# Patient Record
Sex: Female | Born: 1980 | Race: White | Hispanic: No | Marital: Married | Smoking: Never smoker
Health system: Southern US, Community
[De-identification: ages and names within clinical notes are randomized; demographics above are authoritative.]

## PROBLEM LIST (undated history)

## (undated) DIAGNOSIS — Z9882 Breast implant status: Secondary | ICD-10-CM

## (undated) DIAGNOSIS — E039 Hypothyroidism, unspecified: Secondary | ICD-10-CM

## (undated) HISTORY — DX: Breast implant status: Z98.82

---

## 2004-09-26 ENCOUNTER — Emergency Department: Payer: Self-pay | Admitting: Emergency Medicine

## 2008-10-12 ENCOUNTER — Observation Stay: Payer: Self-pay | Admitting: Obstetrics and Gynecology

## 2009-02-18 ENCOUNTER — Encounter: Payer: Self-pay | Admitting: Maternal & Fetal Medicine

## 2009-06-24 ENCOUNTER — Observation Stay: Payer: Self-pay

## 2009-09-01 ENCOUNTER — Inpatient Hospital Stay: Payer: Self-pay | Admitting: Obstetrics and Gynecology

## 2010-01-04 ENCOUNTER — Ambulatory Visit: Payer: Self-pay | Admitting: Family Medicine

## 2010-03-29 ENCOUNTER — Ambulatory Visit: Payer: Self-pay | Admitting: Internal Medicine

## 2010-05-26 ENCOUNTER — Ambulatory Visit: Payer: Self-pay | Admitting: Otolaryngology

## 2011-05-30 ENCOUNTER — Ambulatory Visit: Payer: Self-pay | Admitting: Otolaryngology

## 2011-07-14 ENCOUNTER — Ambulatory Visit: Payer: Self-pay | Admitting: Obstetrics and Gynecology

## 2011-07-17 ENCOUNTER — Inpatient Hospital Stay: Payer: Self-pay | Admitting: Obstetrics and Gynecology

## 2011-09-18 ENCOUNTER — Ambulatory Visit: Payer: Self-pay | Admitting: Otolaryngology

## 2012-03-18 IMAGING — US US THYROID
1 series · 17 of 25 positions shown · non-contrast
Comparison: none

REASON FOR EXAM: thyroid nodule
COMMENTS:

[Series 1: us thyroid · 17 of 54 slices shown]
[im 1/54]
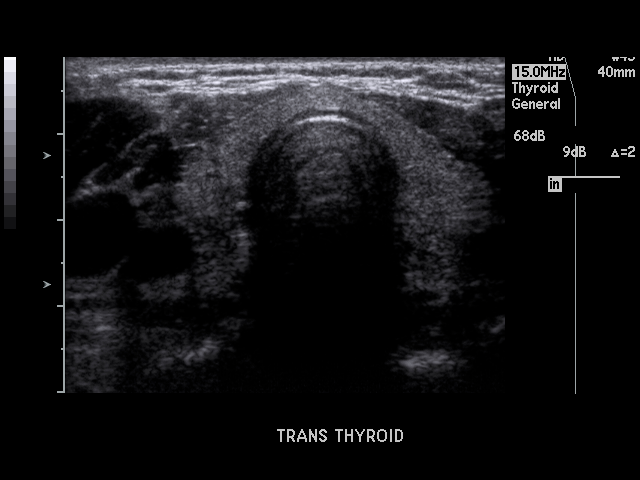
[im 5/54]
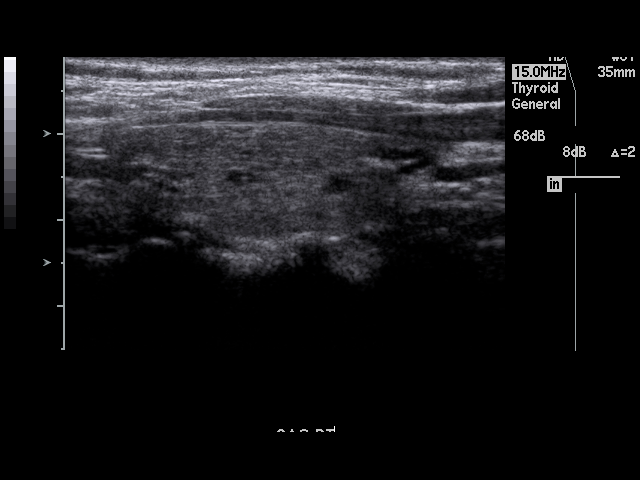
[im 7/54]
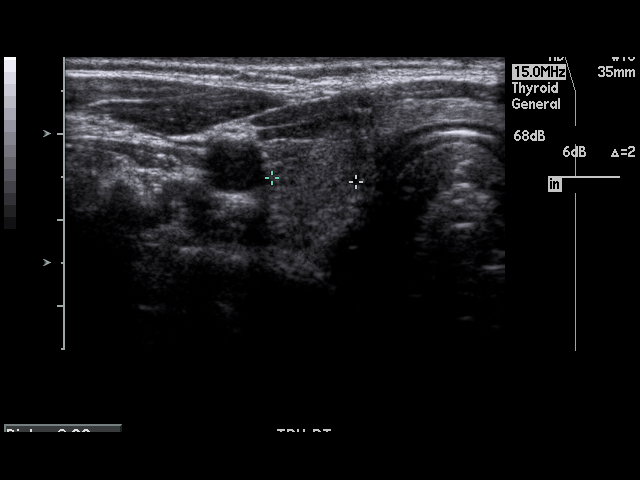
[im 12/54]
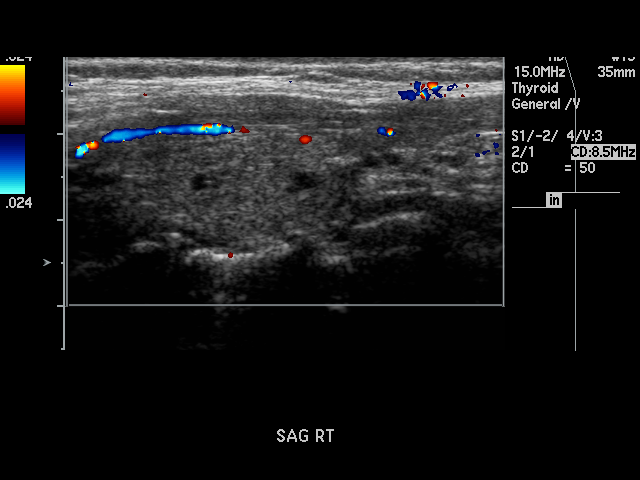
[im 14/54]
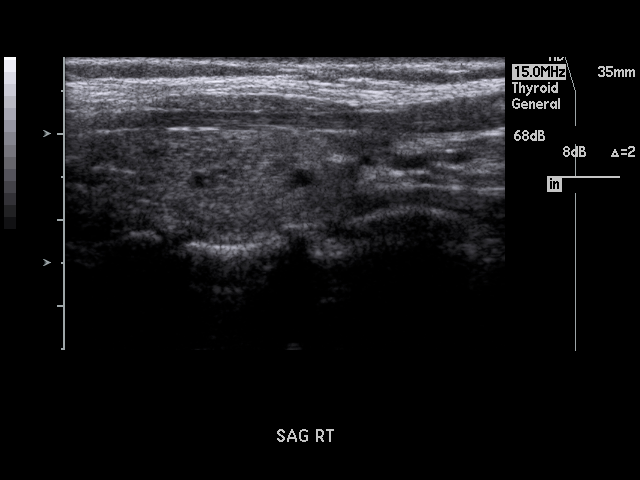
[im 18/54]
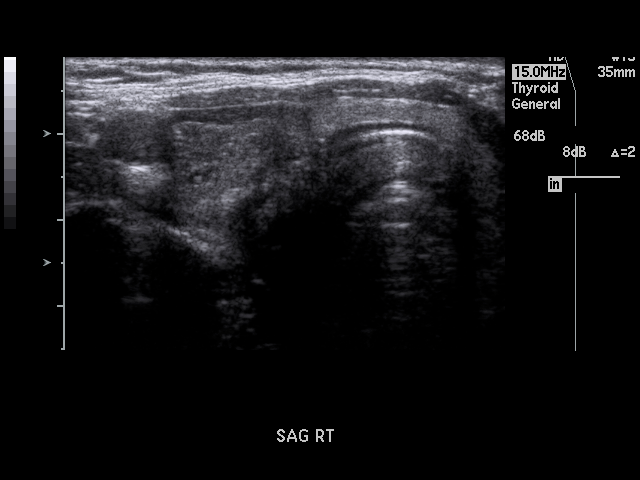
[im 20/54]
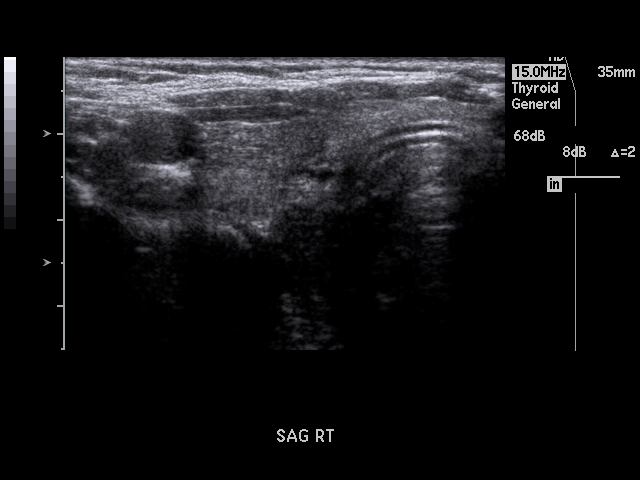
[im 25/54]
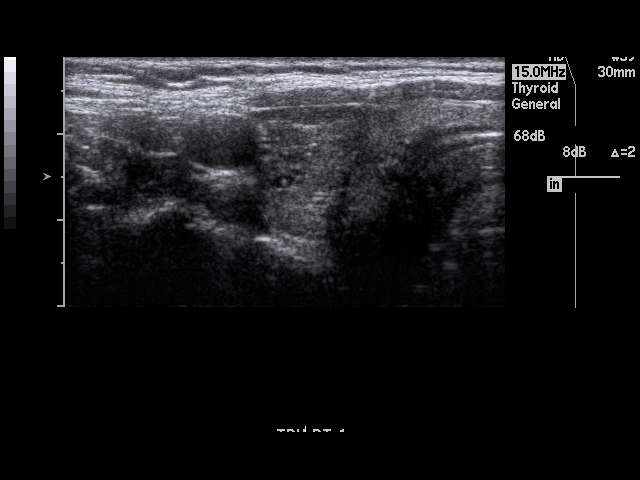
[im 27/54]
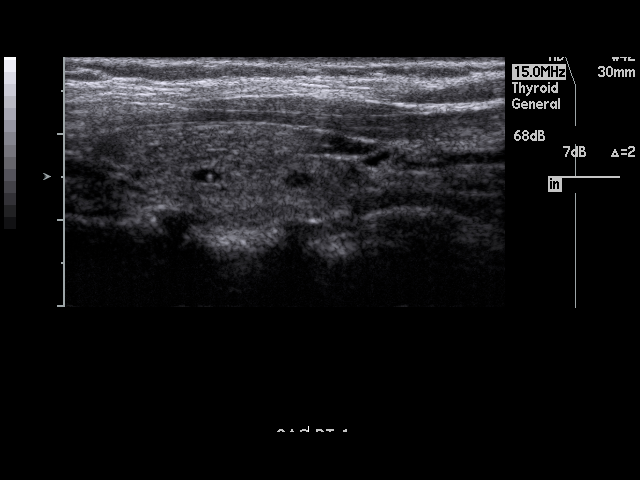
[im 29/54]
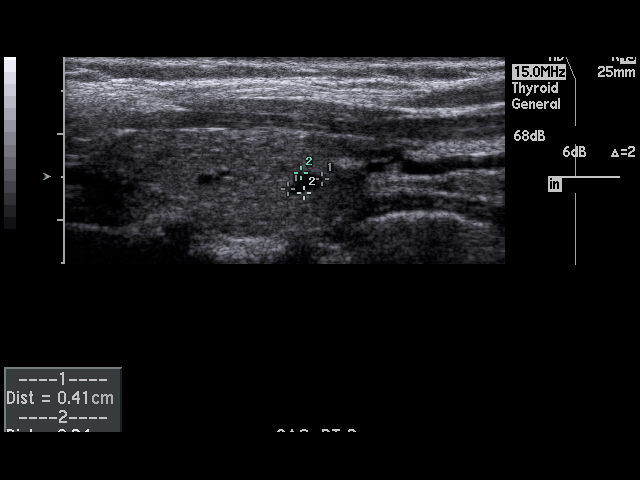
[im 34/54]
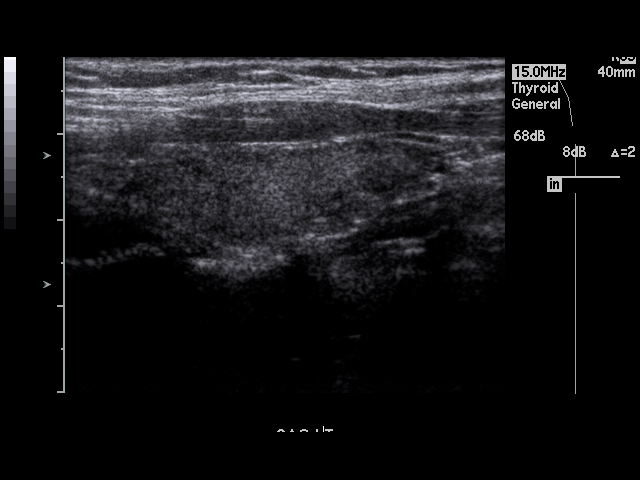
[im 36/54]
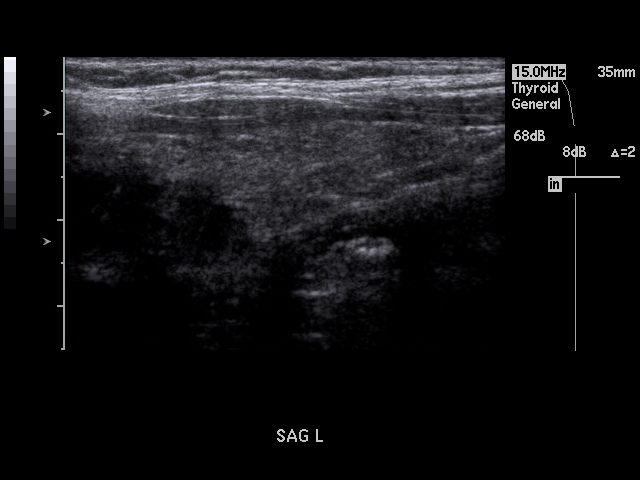
[im 40/54]
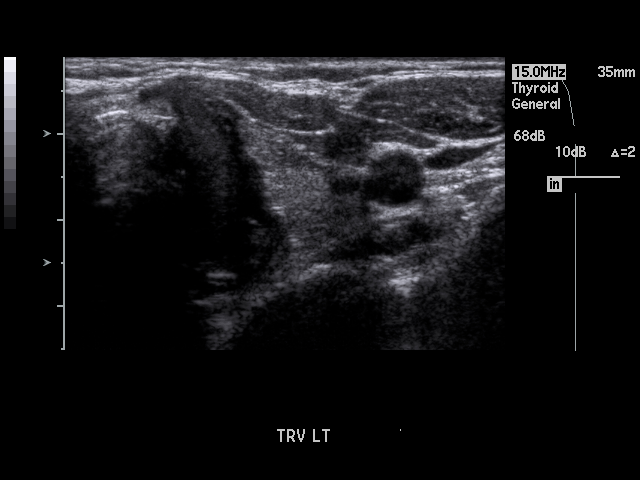
[im 42/54]
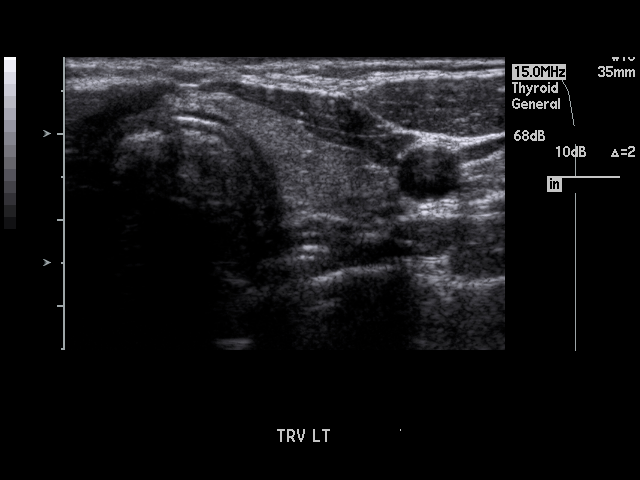
[im 47/54]
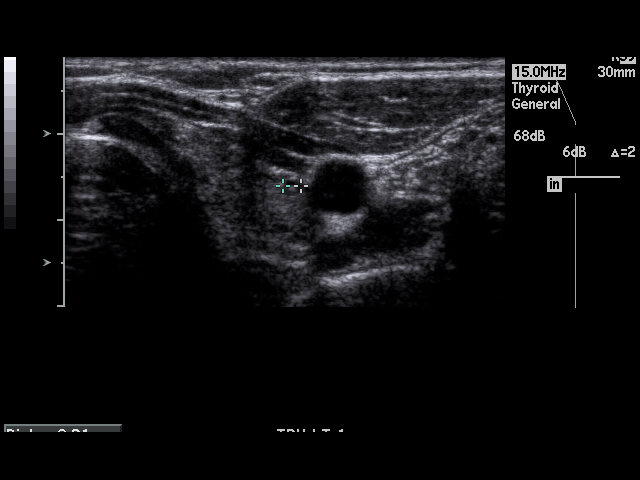
[im 49/54]
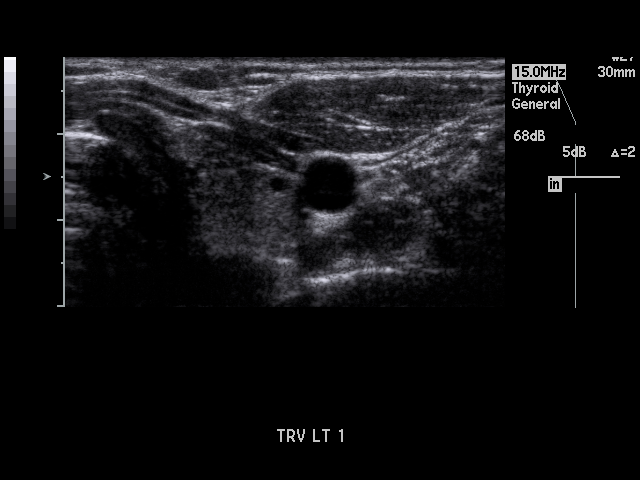
[im 54/54]
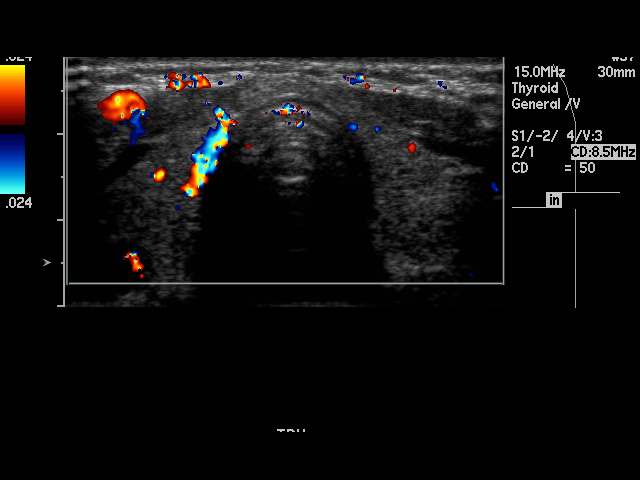

[17 of 25 positions shown; findings below may reference images not displayed]

PROCEDURE:     DALVIN - DALVIN THYROID  - May 30, 2011 [DATE]

RESULT:     The right lobe of the thyroid measures 2.9 cm x 0.98 cm x
cm and the left lobe measures 3.83 cm x 1.06 cm x 1.25 cm. The previously
present 3.4 cm, anechoic nodule in the right lobe, described on the exam of
March 29, 2010, is no longer present consistent with interval aspiration. The
current exam shows a 3 mm, anechoic nodule at the midpole on the right.
There is a tiny associated echodensity that may represent a fleck of
calcium. At the lower pole of the right lobe, there is a 4.1 mm, anechoic or
near anechoic nodule. At the lower pole of the left lobe, there is a 2.3 mm,
anechoic nodule with an associated bright echo that may represent a fleck of
calcium associated with the wall. No additional nodules are seen. No
calcification within the thyroid parenchyma peripheral to the nodules is
observed. The thyroid echotexture is homogeneous.
IMPRESSION: 1.  The large anechoic mass previously noted in the right lobe is no longer
seen consistent with interval aspiration.
2.  The current exam shows two, tiny anechoic areas on the right with the
larger measuring 3 mm at maximum diameter and containing a fleck of calcium.
3.  On the left, there is a 2.3 mm, anechoic nodule containing a fleck of
calcium.
4.  The thyroid echotexture is homogeneous.

## 2012-07-07 IMAGING — US US THYROID
1 series · 17 of 25 positions shown · non-contrast
Comparison: none

REASON FOR EXAM: nodule  compare to May 2011
COMMENTS:

[Series 1: us thyroid · 17 of 56 slices shown]
[im 1/56]
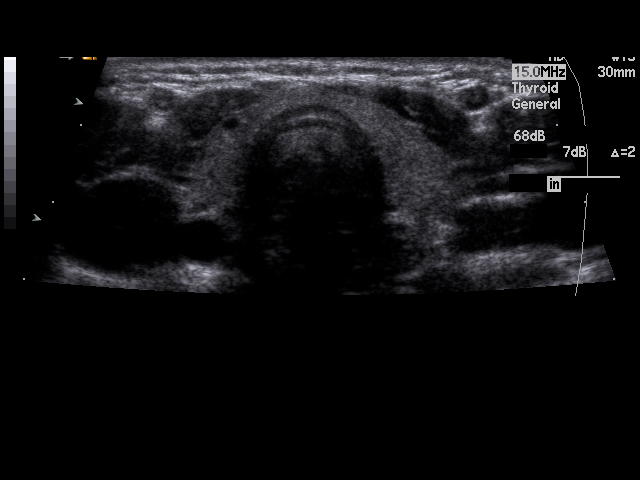
[im 5/56]
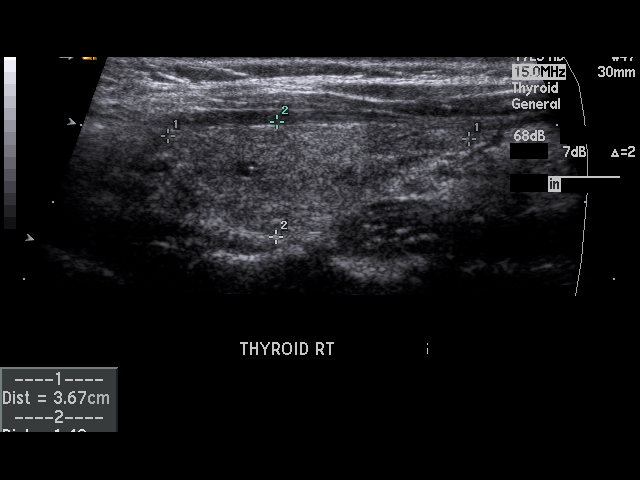
[im 7/56]
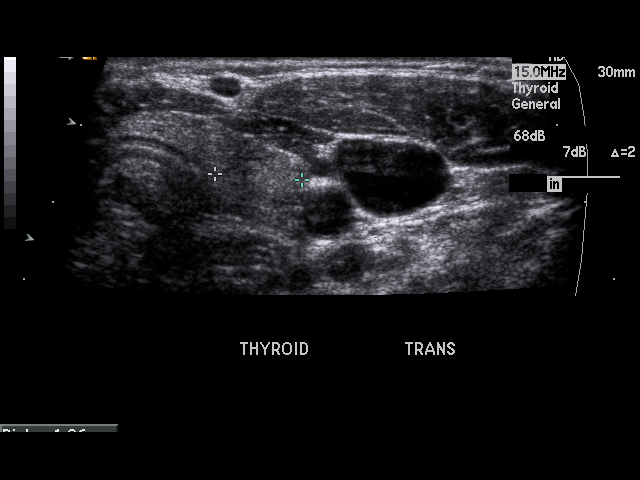
[im 12/56]
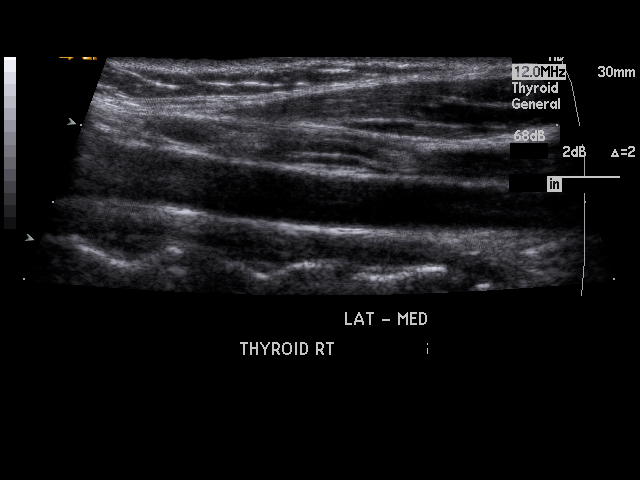
[im 14/56]
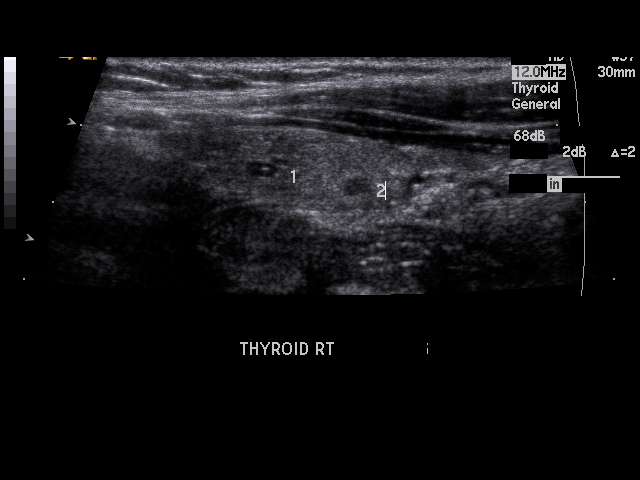
[im 19/56]
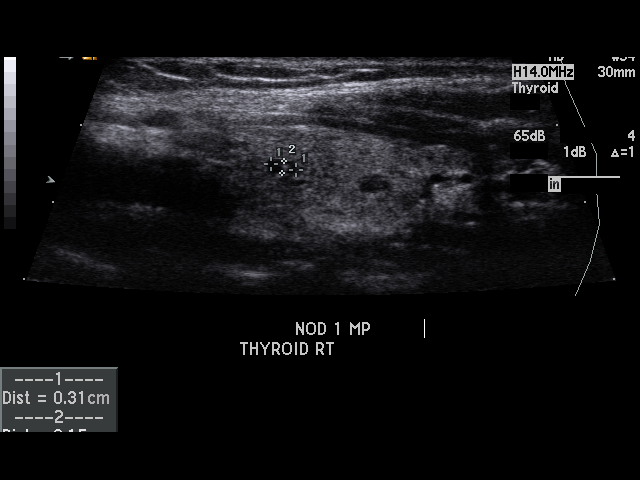
[im 21/56]
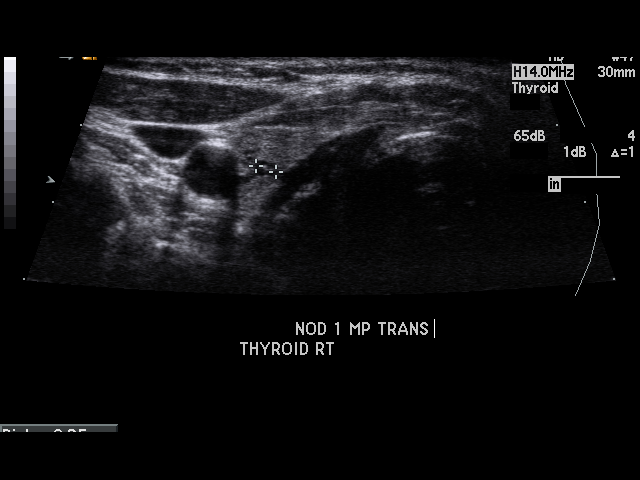
[im 26/56]
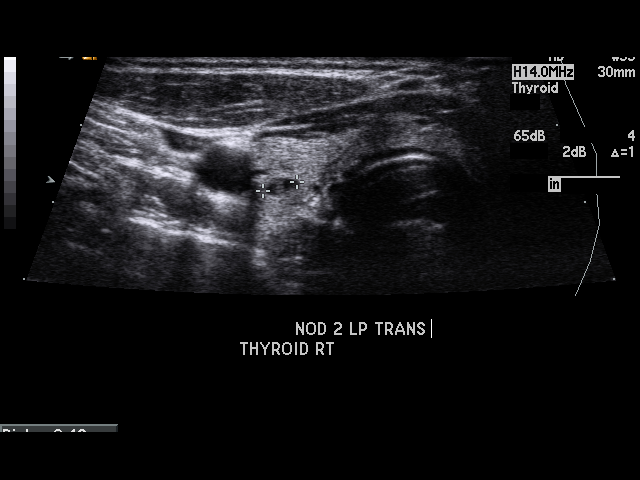
[im 28/56]
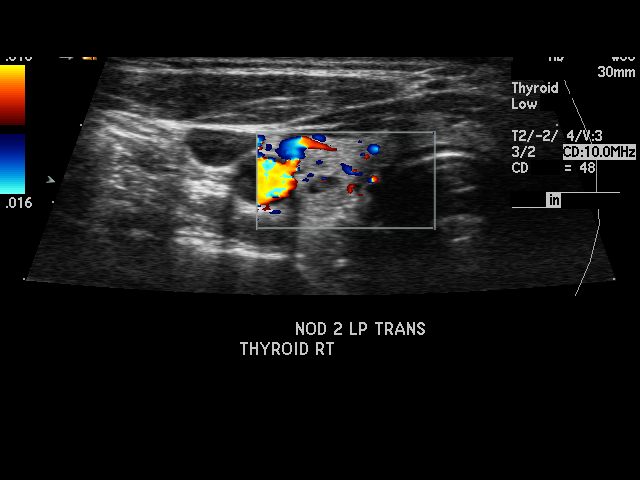
[im 30/56]
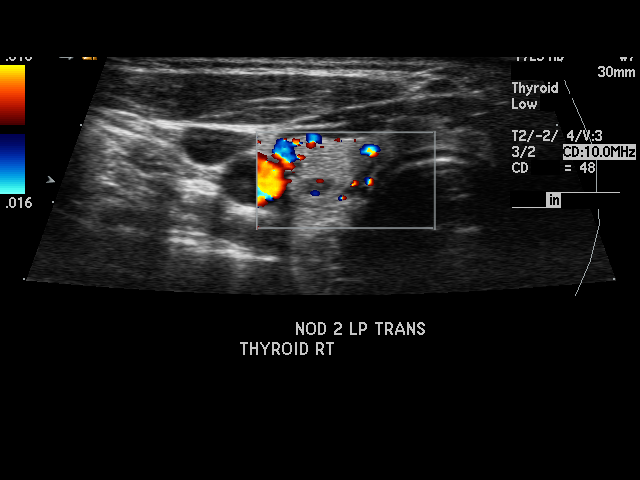
[im 35/56]
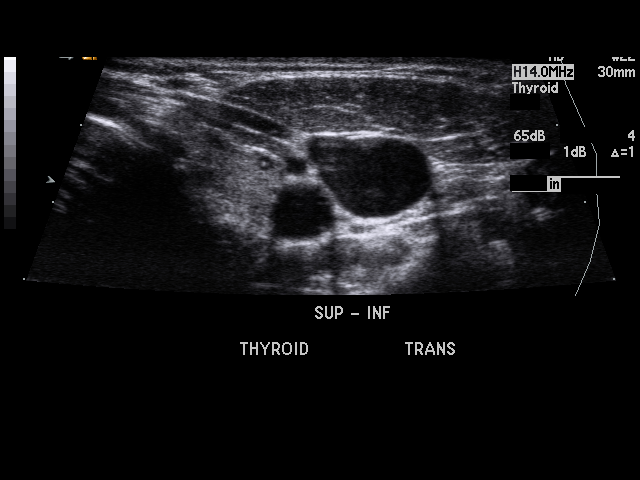
[im 37/56]
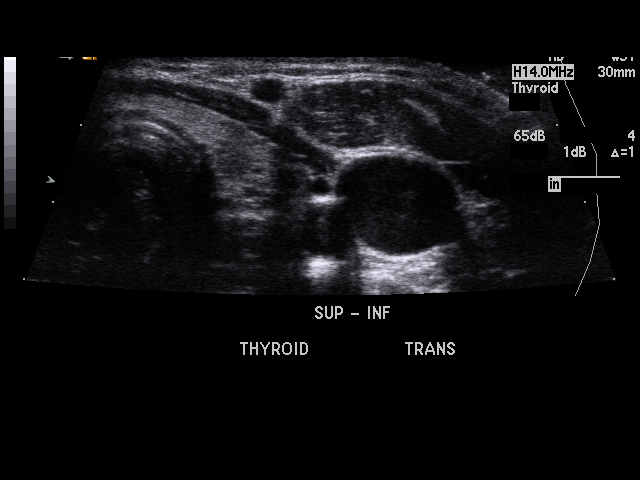
[im 42/56]
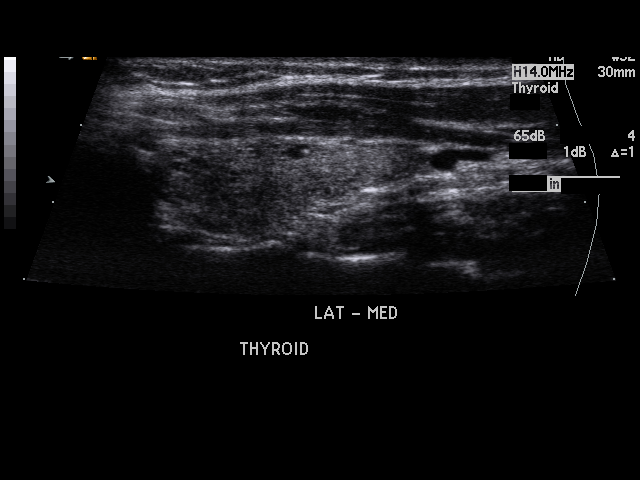
[im 44/56]
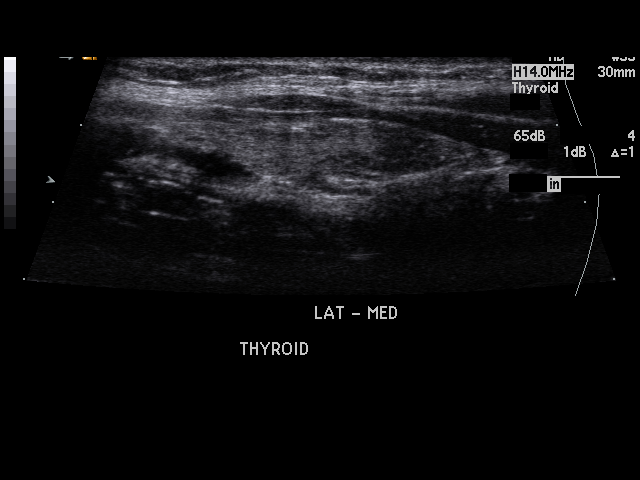
[im 49/56]
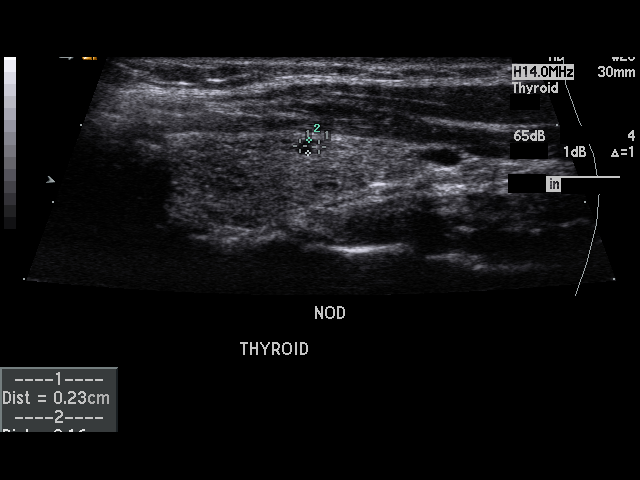
[im 51/56]
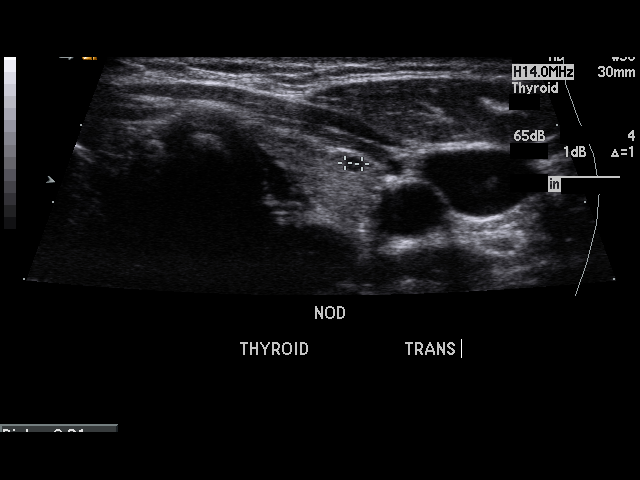
[im 56/56]
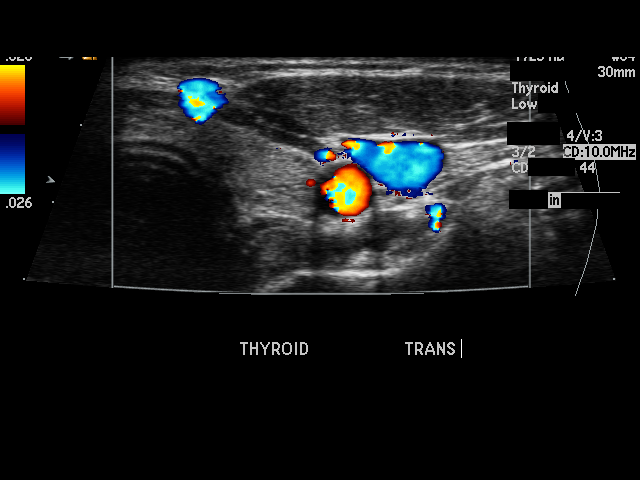

[17 of 25 positions shown; findings below may reference images not displayed]

PROCEDURE:     HOSAIN - HOSAIN THYROID  - September 18, 2011 [DATE]

RESULT:     The right lobe of the thyroid measures 3.67 cm x 1.45 cm x
cm and the left lobe measures 3.8 cm x 1.24 cm x 1.06 cm. On the right,
there is a 3.1 mm anechoic nodule containing a fleck of calcium and
unchanged as compared to the prior exam. There is a second hypoechoic nodule
in the lower pole on the right. The nodule measures 4.1 mm in diameter and
does not appear appreciably changed as compared to the previous exam. No new
thyroid masses or nodules are identified. In the left lobe there is a near
anechoic 2.3 mm nodule containing a fleck of calcium and located at the
upper pole. This too is unchanged as compared to the prior exam.
IMPRESSION: 1. The thyroid lobes are within normal limits for size bilaterally.
2. There are a few tiny thyroid nodules observed bilaterally and which
appear stable as compared to the prior exam.

## 2014-05-19 ENCOUNTER — Ambulatory Visit: Payer: Self-pay | Admitting: Obstetrics and Gynecology

## 2014-05-19 LAB — CBC WITH DIFFERENTIAL/PLATELET
Basophil #: 0 10*3/uL (ref 0.0–0.1)
Basophil %: 0.2 %
EOS ABS: 0.1 10*3/uL (ref 0.0–0.7)
EOS PCT: 1.4 %
HCT: 36.6 % (ref 35.0–47.0)
HGB: 12.7 g/dL (ref 12.0–16.0)
LYMPHS ABS: 1.6 10*3/uL (ref 1.0–3.6)
LYMPHS PCT: 18.8 %
MCH: 34.3 pg — ABNORMAL HIGH (ref 26.0–34.0)
MCHC: 34.8 g/dL (ref 32.0–36.0)
MCV: 99 fL (ref 80–100)
Monocyte #: 0.7 x10 3/mm (ref 0.2–0.9)
Monocyte %: 8.6 %
NEUTROS ABS: 6.2 10*3/uL (ref 1.4–6.5)
NEUTROS PCT: 71 %
Platelet: 125 10*3/uL — ABNORMAL LOW (ref 150–440)
RBC: 3.71 10*6/uL — ABNORMAL LOW (ref 3.80–5.20)
RDW: 14 % (ref 11.5–14.5)
WBC: 8.7 10*3/uL (ref 3.6–11.0)

## 2014-05-20 ENCOUNTER — Inpatient Hospital Stay: Payer: Self-pay | Admitting: Obstetrics and Gynecology

## 2014-05-20 LAB — RAPID HIV SCREEN (HIV 1/2 AB+AG)

## 2014-05-21 LAB — HEMATOCRIT: HCT: 31.7 % — ABNORMAL LOW (ref 35.0–47.0)

## 2014-05-22 LAB — PATHOLOGY REPORT

## 2015-02-27 NOTE — Op Note (Signed)
PATIENT NAME:  Kari Gibson, Kari Gibson MR#:  [id number] DATE OF BIRTH:  1981-02-20  DATE OF PROCEDURE:  05/20/2014.  PREOPERATIVE DIAGNOSIS:  1. Elective repeat cesarean section.  2. Elective permanent sterilization.  POSTOPERATIVE DIAGNOSES:  1. Elective repeat cesarean section.  2. Elective permanent sterilization.  PROCEDURE:  1.  Repeat low transverse cesarean section.  2.  Bilateral tubal ligation, [place].   SURGEON: Suzy Bouchard, MD.  ANESTHESIA: Spinal.   INDICATIONS: This is a 34 year old gravida 4 para 2 patient at 15 + 0 weeks estimated gestational age. Patient with prior cesarean, elects for repeat cesarean section and elective permanent sterilization. The patient reconfirms the desire for elective permanent sterilization.   PROCEDURE: After adequate spinal anesthesia, the patient was placed in the dorsal supine position with a hip roll under the right side.    A Pfannenstiel incision was made 2 fingerbreadths above the symphysis pubis. Sharp dissection was used to identify the fascia. The fascia was opened in the midline and opened in a transverse fashion. The superior aspect of the fascia was grasped with Kocher clamps and the rectus muscles were dissected free. The inferior aspect of the fascia was grasped with Coker clamps and the pyramidalis muscle was dissected free. Entry into the peritoneal cavity was accomplished sharply. The vesicouterine peritoneal fold was identified and opened. A bladder flap was created. The bladder was reflected inferiorly.   A low transverse uterine incision was made. Upon entry into the endometrial cavity, clear fluid resulted. The uterine incision was extended with blunt transverse traction. The fetal head, shoulders and body were delivered without difficulty, a vigorous female.  The cord was doubly clamped, and the infant was passed to the neonatal staff, who assigned Apgar scores of 8 and 9.   The placenta was then manually delivered and  the uterus was exteriorized. The endometrial cavity was wiped clean with laparotomy tape and the cervix was opened with ring forceps and this was passed off the operative field. The uterine incision was closed with #1 chromic suture in a running locking fashion with good approximation of edges. Good hemostasis was noted.   Attention was directed to the patient's right fallopian tube, which was grasped at the midportion of the fallopian tube and 2 separate 0-plain gut sutures were applied. A 1.5-cm portion of fallopian tube was removed. A similar procedure was repeated on the patient's left fallopian tube. Again, 2 separate 0-plain gut sutures were applied and a 1.5 cm portion of fallopian tube was removed.   Good hemostasis was noted. The posterior cul-de-sac was irrigated and suctioned. The uterus was placed back into the abdominal cavity and the paracolic gutters were wiped clean with laparotomy tape. The uterine incision again appeared hemostatic. Interceed was placed over the lower uterine segment in a T-shaped fashion. Tubal ligation sites appeared hemostatic.   The superior aspect of the fascia was grasped with Coker clamps and the On-Q pump catheters were advanced from an inferoumbilical position to a subfascial position. The fascia was closed over the top these with 0-Vicryl suture in a running nonlocking fashion with good approximation of edges. Subcutaneous tissues were irrigated and the subcutaneous tissues Bovied for hemostasis and the skin was reapproximated with staples.   ESTIMATED BLOOD LOSS: 700 mL.   INTRAOPERATIVE FLUIDS: 1000 mL.    The patient did receive 2 grams IV Ancef prior to commencement of the case. The patient was taken to the recovery room in good condition.    ____________________________ Suzy Bouchard, MD  tjs:lt D: 05/20/2014 10:30:01 ET T: 05/20/2014 12:54:56 ET JOB#: [id number]  cc: Suzy Bouchardhomas J. Reyhan Moronta, MD, <Dictator> Suzy BouchardHOMAS J Ural Acree  MD ELECTRONICALLY SIGNED 05/20/2014 21:34

## 2016-08-28 ENCOUNTER — Other Ambulatory Visit: Payer: Self-pay | Admitting: Obstetrics and Gynecology

## 2016-08-28 DIAGNOSIS — Z1231 Encounter for screening mammogram for malignant neoplasm of breast: Secondary | ICD-10-CM

## 2016-08-30 ENCOUNTER — Ambulatory Visit
Admission: RE | Admit: 2016-08-30 | Discharge: 2016-08-30 | Disposition: A | Discharge status: Home / Self Care | Payer: BLUE CROSS/BLUE SHIELD | Source: Ambulatory Visit | Attending: Obstetrics and Gynecology | Admitting: Obstetrics and Gynecology

## 2016-08-30 ENCOUNTER — Encounter: Payer: Self-pay | Admitting: Radiology

## 2016-08-30 DIAGNOSIS — Z1231 Encounter for screening mammogram for malignant neoplasm of breast: Secondary | ICD-10-CM

## 2021-06-14 ENCOUNTER — Ambulatory Visit (INDEPENDENT_AMBULATORY_CARE_PROVIDER_SITE_OTHER): Payer: 59

## 2021-06-14 ENCOUNTER — Other Ambulatory Visit: Payer: Self-pay

## 2021-06-14 ENCOUNTER — Ambulatory Visit (INDEPENDENT_AMBULATORY_CARE_PROVIDER_SITE_OTHER): Payer: 59 | Admitting: Podiatry

## 2021-06-14 ENCOUNTER — Encounter: Payer: Self-pay | Admitting: Podiatry

## 2021-06-14 DIAGNOSIS — M2011 Hallux valgus (acquired), right foot: Secondary | ICD-10-CM | POA: Diagnosis not present

## 2021-06-14 DIAGNOSIS — Z01818 Encounter for other preprocedural examination: Secondary | ICD-10-CM

## 2021-06-14 DIAGNOSIS — M779 Enthesopathy, unspecified: Secondary | ICD-10-CM

## 2021-06-14 NOTE — Progress Notes (Signed)
Subjective:  Patient ID: Kari Gibson, female    DOB: 04-May-1981,  MRN: [id number]  No chief complaint on file.   40 y.o. female presents with the above complaint.  Patient presents with complaint of right bunion pain.  Patient states is painful to touch.  She states the bunion is not too large however it is definitely causing her a lot of pain.  She has tried some conservative treatment options include padding protecting shoe gear modification none of which has helped.  She is here to discuss surgical intervention.  She denies seeing anyone else prior to seeing me.  She denies any other acute complaints.  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  Review of Systems: Negative except as noted in the HPI. Denies N/V/F/Ch.  No past medical history on file. No current outpatient medications on file.  Social History   Tobacco Use  Smoking Status Not on file  Smokeless Tobacco Not on file    Not on File Objective:  There were no vitals filed for this visit. There is no height or weight on file to calculate BMI. Constitutional Well developed. Well nourished.  Vascular Dorsalis pedis pulses palpable bilaterally. Posterior tibial pulses palpable bilaterally. Capillary refill normal to all digits.  No cyanosis or clubbing noted. Pedal hair growth normal.  Neurologic Normal speech. Oriented to person, place, and time. Epicritic sensation to light touch grossly present bilaterally.  Dermatologic Nails well groomed and normal in appearance. No open wounds. No skin lesions.  Orthopedic: Normal joint ROM without pain or crepitus bilaterally. 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  Pain on palpation.  This is a tracking not a track bound deformity.  No intra-articular first MPJ pain noted. Left 1st MPJ diminished range of motion. Left 1st TMT without gross hypermobility. Right 1st MPJ diminished range of motion  Right 1st TMT without gross  hypermobility. Lesser digital contractures present right.   Radiographs: Taken and reviewed. Hallux abductovalgus deformity present. Metatarsal parabola normal. 1st/2nd IMA: Moderate; TSP: 5 out of 7  Assessment:   1. Tendinitis    Plan:  Patient was evaluated and treated and all questions answered.  Hallux abductovalgus deformity, right -XR as above. -Patient has failed all conservative therapy and wishes to proceed with surgical intervention. All risks, benefits, and alternatives discussed with patient. No guarantees given. Consent reviewed and signed by patient. Post-op course explained at length. -Planned procedures: Chevron osteotomy with possible phalangeal osteotomy -Risk factors: None -I discussed my preoperative intraoperative and postoperative plan with the patient in extensive detail.  Given the patient has failed all conservative treatment options at this time she will benefit from surgical intervention to correct the bunion with a possible bunionectomy and phalangeal osteotomy.  I discussed my plans in extensive detail she states understand like to proceed with the surgery.  She will be weightbearing as tolerated cam boot afterwards. -Informed surgical risk consent was reviewed and read aloud to the patient.  I reviewed the films.  I have discussed my findings with the patient in great detail.  I have discussed all risks including but not limited to infection, stiffness, scarring, limp, disability, deformity, damage to blood vessels and nerves, numbness, poor healing, need for braces, arthritis, chronic pain, amputation, death.  All benefits and realistic expectations discussed in great detail.  I have made no promises as to the outcome.  I have provided realistic expectations.  I have offered the patient a 2nd opinion, which they have  declined and assured me they preferred to proceed despite the risks   No follow-ups on file.

## 2021-07-06 ENCOUNTER — Other Ambulatory Visit: Payer: Self-pay

## 2021-07-06 ENCOUNTER — Emergency Department
Admission: EM | Admit: 2021-07-06 | Discharge: 2021-07-06 | Disposition: A | Discharge status: Home / Self Care | Payer: 59 | Attending: Emergency Medicine | Admitting: Emergency Medicine

## 2021-07-06 ENCOUNTER — Ambulatory Visit: Admit: 2021-07-06 | Payer: 59 | Source: Home / Self Care

## 2021-07-06 ENCOUNTER — Emergency Department: Payer: 59

## 2021-07-06 DIAGNOSIS — S46912A Strain of unspecified muscle, fascia and tendon at shoulder and upper arm level, left arm, initial encounter: Secondary | ICD-10-CM | POA: Diagnosis not present

## 2021-07-06 DIAGNOSIS — Y92410 Unspecified street and highway as the place of occurrence of the external cause: Secondary | ICD-10-CM | POA: Diagnosis not present

## 2021-07-06 DIAGNOSIS — S40912A Unspecified superficial injury of left shoulder, initial encounter: Secondary | ICD-10-CM | POA: Diagnosis present

## 2021-07-06 MED ORDER — METHOCARBAMOL 500 MG PO TABS: 500.0000 mg | ORAL_TABLET | Freq: Four times a day (QID) | ORAL | 0 refills | Status: AC | PRN

## 2021-07-06 MED ORDER — TRAMADOL HCL 50 MG PO TABS: 50.0000 mg | ORAL_TABLET | Freq: Four times a day (QID) | ORAL | 0 refills | Status: AC | PRN

## 2021-07-06 MED ORDER — MELOXICAM 15 MG PO TABS: 15.0000 mg | ORAL_TABLET | Freq: Every day | ORAL | 0 refills | Status: AC
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
## 2021-07-06 NOTE — Discharge Instructions (Addendum)
Follow up with primary care or [facility].  Return to the ER for symptoms that change or worsen if unable to schedule an appointment.

## 2021-07-22 ENCOUNTER — Telehealth: Payer: Self-pay | Admitting: Urology

## 2021-07-22 NOTE — Telephone Encounter (Signed)
DOS - 08/15/21   [place] BUNIONECTOMY RIGHT --- [id number] DOUBLE OSTEOTOMY RIGHT --- [phone]  [facility] EFFECTIVE DATE - 11/06/20   RECEIVED FAX FROM [facility] STATING THAT CPT CODES 74944 AND [phone] HAS BEEN APPROVED, AUTH # [id number], GOOD FROM 08/15/21 - 08/15/21.

## 2021-08-15 ENCOUNTER — Encounter: Payer: Self-pay | Admitting: Podiatry

## 2021-08-15 ENCOUNTER — Other Ambulatory Visit: Payer: Self-pay | Admitting: Podiatry

## 2021-08-15 DIAGNOSIS — M2011 Hallux valgus (acquired), right foot: Secondary | ICD-10-CM | POA: Diagnosis not present

## 2021-08-15 MED ORDER — IBUPROFEN 800 MG PO TABS: 800.0000 mg | ORAL_TABLET | Freq: Four times a day (QID) | ORAL | 1 refills | Status: AC | PRN

## 2021-08-15 MED ORDER — OXYCODONE-ACETAMINOPHEN 5-325 MG PO TABS
1.0000 | ORAL_TABLET | ORAL | 0 refills | Status: AC | PRN
Start: 2021-08-15 — End: ?

## 2021-08-23 ENCOUNTER — Other Ambulatory Visit: Payer: Self-pay

## 2021-08-23 ENCOUNTER — Encounter: Payer: Self-pay | Admitting: Podiatry

## 2021-08-23 ENCOUNTER — Ambulatory Visit (INDEPENDENT_AMBULATORY_CARE_PROVIDER_SITE_OTHER): Payer: 59 | Admitting: Podiatry

## 2021-08-23 ENCOUNTER — Ambulatory Visit (INDEPENDENT_AMBULATORY_CARE_PROVIDER_SITE_OTHER): Payer: 59

## 2021-08-23 DIAGNOSIS — Z9889 Other specified postprocedural states: Secondary | ICD-10-CM

## 2021-08-23 DIAGNOSIS — M2011 Hallux valgus (acquired), right foot: Secondary | ICD-10-CM | POA: Diagnosis not present

## 2021-08-23 NOTE — Progress Notes (Signed)
  Subjective:  Patient ID: Kari Gibson, female    DOB: 09/05/1981,  MRN: [id number]  Chief Complaint  Patient presents with   Routine Post [name] 10.10.22    DOS: 08/15/2021 Procedure: Right bunionectomy  40 y.o. female returns for post-op check.  Patient states she is doing well.  Pain is controlled.  Occasional numbness and tingling.  Weightbearing as tolerated in cam boot.  She denies any other acute complaints.  Review of Systems: Negative except as noted in the HPI. Denies N/V/F/Ch.  No past medical history on file.  Current Outpatient Medications:    ibuprofen (ADVIL) 800 MG tablet, Take 1 tablet (800 mg total) by mouth every 6 (six) hours as needed., Disp: 60 tablet, Rfl: 1   meloxicam (MOBIC) 15 MG tablet, Take 1 tablet (15 mg total) by mouth daily., Disp: 30 tablet, Rfl: 0   methocarbamol (ROBAXIN) 500 MG tablet, Take 1 tablet (500 mg total) by mouth every 6 (six) hours as needed for muscle spasms., Disp: 30 tablet, Rfl: 0   oxyCODONE-acetaminophen (PERCOCET) 5-325 MG tablet, Take 1 tablet by mouth every 4 (four) hours as needed for severe pain., Disp: 30 tablet, Rfl: 0   traMADol (ULTRAM) 50 MG tablet, Take 1 tablet (50 mg total) by mouth every 6 (six) hours as needed., Disp: 12 tablet, Rfl: 0  Social History   Tobacco Use  Smoking Status Not on file  Smokeless Tobacco Not on file    Not on File Objective:  There were no vitals filed for this visit. There is no height or weight on file to calculate BMI. Constitutional Well developed. Well nourished.  Vascular Foot warm and well perfused. Capillary refill normal to all digits.   Neurologic Normal speech. Oriented to person, place, and time. Epicritic sensation to light touch grossly present bilaterally.  Dermatologic Skin healing well without signs of infection. Skin edges well coapted without signs of infection.  Orthopedic: Tenderness to palpation noted about the surgical site.   Radiographs: 3 views of  skeletally mature adult right foot: Hardware is intact.  No signs of loosening or backing out noted.  Reduction of sesamoid noted.  Reduction of bunion deformity noted. Assessment:   1. Hav (hallux abducto valgus), right   2. Status post foot surgery    Plan:  Patient was evaluated and treated and all questions answered.  S/p foot surgery right -Progressing as expected post-operatively. -XR: See above -WB Status: Weightbearing as tolerated in cam boot -Sutures: Intact.  No clinical signs of dehiscence noted.  No complication noted. -Medications: None -Foot redressed.  No follow-ups on file.

## 2021-09-06 ENCOUNTER — Other Ambulatory Visit: Payer: Self-pay

## 2021-09-06 ENCOUNTER — Ambulatory Visit (INDEPENDENT_AMBULATORY_CARE_PROVIDER_SITE_OTHER): Payer: 59 | Admitting: Podiatry

## 2021-09-06 DIAGNOSIS — Z9889 Other specified postprocedural states: Secondary | ICD-10-CM

## 2021-09-06 DIAGNOSIS — M2011 Hallux valgus (acquired), right foot: Secondary | ICD-10-CM

## 2021-09-06 NOTE — Progress Notes (Signed)
  Subjective:  Patient ID: Kari Gibson, female    DOB: [birth year],  MRN: [id number]  Chief Complaint  Patient presents with   Routine Post Op     POV #2 DOS 08/15/2021 RT BUNIONECTOMY W/POSSIBLE PHELANGEAL OSTEOTOMY    DOS: 08/15/2021 Procedure: Right bunionectomy  40 y.o. female returns for post-op check.  Patient states she is doing well.  Pain is controlled.  Occasional numbness and tingling.  Weightbearing as tolerated in cam boot.  She denies any other acute complaints.  Review of Systems: Negative except as noted in the HPI. Denies N/V/F/Ch.  No past medical history on file.  Current Outpatient Medications:    ibuprofen (ADVIL) 800 MG tablet, Take 1 tablet (800 mg total) by mouth every 6 (six) hours as needed., Disp: 60 tablet, Rfl: 1   meloxicam (MOBIC) 15 MG tablet, Take 1 tablet (15 mg total) by mouth daily., Disp: 30 tablet, Rfl: 0   methocarbamol (ROBAXIN) 500 MG tablet, Take 1 tablet (500 mg total) by mouth every 6 (six) hours as needed for muscle spasms., Disp: 30 tablet, Rfl: 0   oxyCODONE-acetaminophen (PERCOCET) 5-325 MG tablet, Take 1 tablet by mouth every 4 (four) hours as needed for severe pain., Disp: 30 tablet, Rfl: 0   traMADol (ULTRAM) 50 MG tablet, Take 1 tablet (50 mg total) by mouth every 6 (six) hours as needed., Disp: 12 tablet, Rfl: 0  Social History   Tobacco Use  Smoking Status Not on file  Smokeless Tobacco Not on file    Not on File Objective:  There were no vitals filed for this visit. There is no height or weight on file to calculate BMI. Constitutional Well developed. Well nourished.  Vascular Foot warm and well perfused. Capillary refill normal to all digits.   Neurologic Normal speech. Oriented to person, place, and time. Epicritic sensation to light touch grossly present bilaterally.  Dermatologic Clinically we epithelialized.  Adequate range of motion noted the first metatarsophalangeal joint  Orthopedic: Mild tenderness to  palpation noted about the surgical site.   Radiographs: 3 views of skeletally mature adult right foot: Hardware is intact.  No signs of loosening or backing out noted.  Reduction of sesamoid noted.  Reduction of bunion deformity noted. Assessment:   1. Hav (hallux abducto valgus), right   2. Status post foot surgery     Plan:  Patient was evaluated and treated and all questions answered.  S/p foot surgery right -Progressing as expected post-operatively. -XR: See above -WB Status: Begin transition to regular shoes -Sutures: Removed.  No clinical signs of dehiscence noted.  No complication noted. -Medications: None -Foot redressed.  No follow-ups on file.

## 2021-10-06 ENCOUNTER — Ambulatory Visit (INDEPENDENT_AMBULATORY_CARE_PROVIDER_SITE_OTHER): Payer: 59

## 2021-10-06 ENCOUNTER — Ambulatory Visit (INDEPENDENT_AMBULATORY_CARE_PROVIDER_SITE_OTHER): Payer: 59 | Admitting: Podiatry

## 2021-10-06 ENCOUNTER — Other Ambulatory Visit: Payer: Self-pay

## 2021-10-06 DIAGNOSIS — Z9889 Other specified postprocedural states: Secondary | ICD-10-CM

## 2021-10-06 DIAGNOSIS — M2011 Hallux valgus (acquired), right foot: Secondary | ICD-10-CM

## 2021-10-06 DIAGNOSIS — Q666 Other congenital valgus deformities of feet: Secondary | ICD-10-CM

## 2021-10-07 ENCOUNTER — Telehealth: Payer: Self-pay | Admitting: Podiatry
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
## 2021-10-11 NOTE — Progress Notes (Signed)
  Subjective:  Patient ID: Kari Gibson, female    DOB: 1981-10-07,  MRN: [id number]  Chief Complaint  Patient presents with   Routine Post Op    POV #3  DOS 08/15/2021 RT BUNIONECTOMY W/POSSIBLE PHELANGEAL OSTEOTOMY    DOS: 08/15/2021 Procedure: Right bunionectomy  40 y.o. female returns for post-op check.  Patient states she is doing well.  Pain is controlled.  Occasional numbness and tingling.  Weightbearing as tolerated in regular shoes she denies any other acute complaints.  She would also like to discuss orthotics management  Review of Systems: Negative except as noted in the HPI. Denies N/V/F/Ch.  No past medical history on file.  Current Outpatient Medications:    ibuprofen (ADVIL) 800 MG tablet, Take 1 tablet (800 mg total) by mouth every 6 (six) hours as needed., Disp: 60 tablet, Rfl: 1   meloxicam (MOBIC) 15 MG tablet, Take 1 tablet (15 mg total) by mouth daily., Disp: 30 tablet, Rfl: 0   methocarbamol (ROBAXIN) 500 MG tablet, Take 1 tablet (500 mg total) by mouth every 6 (six) hours as needed for muscle spasms., Disp: 30 tablet, Rfl: 0   oxyCODONE-acetaminophen (PERCOCET) 5-325 MG tablet, Take 1 tablet by mouth every 4 (four) hours as needed for severe pain., Disp: 30 tablet, Rfl: 0   traMADol (ULTRAM) 50 MG tablet, Take 1 tablet (50 mg total) by mouth every 6 (six) hours as needed., Disp: 12 tablet, Rfl: 0  Social History   Tobacco Use  Smoking Status Not on file  Smokeless Tobacco Not on file    Not on File Objective:  There were no vitals filed for this visit. There is no height or weight on file to calculate BMI. Constitutional Well developed. Well nourished.  Vascular Foot warm and well perfused. Capillary refill normal to all digits.   Neurologic Normal speech. Oriented to person, place, and time. Epicritic sensation to light touch grossly present bilaterally.  Dermatologic Clinically we epithelialized.  Adequate range of motion noted the first  metatarsophalangeal joint pes planovalgus foot structure noted  Orthopedic: No further tenderness to palpation noted about the surgical site.   Radiographs: 3 views of skeletally mature adult right foot: Hardware is intact.  No signs of loosening or backing out noted.  Reduction of sesamoid noted.  Reduction of bunion deformity noted. Assessment:   1. Hav (hallux abducto valgus), right   2. Status post foot surgery     Plan:  Patient was evaluated and treated and all questions answered.  S/p foot surgery right -Clinically healed and has transition to regular shoes without any restrictions.  At this time I discussed orthotics and shoe gear modification for prevention.  Patient states understanding.  Pes planovalgus -I explained to patient the etiology of pes planovalgus and relationship with bunion deformity and various treatment options were discussed.  Given patient foot structure in the setting of Planter fasciitis I believe patient will benefit from custom-made orthotics to help control the hindfoot motion support the arch of the foot and take the stress away from plantar fascial.  Patient agrees with the plan like to proceed with orthotics -Patient was casted for orthotics   No follow-ups on file.

## 2021-10-12 ENCOUNTER — Telehealth: Payer: Self-pay | Admitting: Podiatry

## 2021-10-12 NOTE — Telephone Encounter (Signed)
Pt returned my call and we discussed that the orthotics will more than likely not be covered and the cost is 438.00. Pt is aware and wants to proceed with the orthotics, They are being ordered. I told pt If she has an appt when they come in I will schedule her to pick up at the appt if not we will call to schedule an appt.

## 2021-10-12 NOTE — Telephone Encounter (Signed)
Pt left message yesterday afternoon on referral line and it was transferred to my voicemail she was returning a call about orthotics.  I returned call and left message for pt to call me back to discuss orthotics.

## 2021-11-17 ENCOUNTER — Ambulatory Visit (INDEPENDENT_AMBULATORY_CARE_PROVIDER_SITE_OTHER): Payer: Self-pay | Admitting: Podiatry

## 2021-11-17 ENCOUNTER — Other Ambulatory Visit: Payer: Self-pay

## 2021-11-17 ENCOUNTER — Encounter: Payer: Self-pay | Admitting: Podiatry

## 2021-11-17 DIAGNOSIS — Z9889 Other specified postprocedural states: Secondary | ICD-10-CM

## 2021-11-17 DIAGNOSIS — M2011 Hallux valgus (acquired), right foot: Secondary | ICD-10-CM

## 2021-11-17 NOTE — Progress Notes (Signed)
°  Subjective:  Patient ID: Kari Gibson, female    DOB: 1981/04/09,  MRN: [id number]  Chief Complaint  Patient presents with   Routine Post Op    "It's good."   Foot Orthotics    "Here to get my orthotics."    DOS: 08/15/2021 Procedure: Right bunionectomy  41 y.o. female returns for post-op check.  Patient states she is doing well.  Pain is controlled.  Occasional numbness and tingling.  Weightbearing as tolerated in regular shoes she denies any other acute complaints.  She would also like to discuss orthotics management  Review of Systems: Negative except as noted in the HPI. Denies N/V/F/Ch.  No past medical history on file.  Current Outpatient Medications:    ibuprofen (ADVIL) 800 MG tablet, Take 1 tablet (800 mg total) by mouth every 6 (six) hours as needed., Disp: 60 tablet, Rfl: 1   meloxicam (MOBIC) 15 MG tablet, Take 1 tablet (15 mg total) by mouth daily., Disp: 30 tablet, Rfl: 0   methocarbamol (ROBAXIN) 500 MG tablet, Take 1 tablet (500 mg total) by mouth every 6 (six) hours as needed for muscle spasms., Disp: 30 tablet, Rfl: 0   oxyCODONE-acetaminophen (PERCOCET) 5-325 MG tablet, Take 1 tablet by mouth every 4 (four) hours as needed for severe pain., Disp: 30 tablet, Rfl: 0   traMADol (ULTRAM) 50 MG tablet, Take 1 tablet (50 mg total) by mouth every 6 (six) hours as needed., Disp: 12 tablet, Rfl: 0  Social History   Tobacco Use  Smoking Status Never  Smokeless Tobacco Never    No Known Allergies Objective:  There were no vitals filed for this visit. There is no height or weight on file to calculate BMI. Constitutional Well developed. Well nourished.  Vascular Foot warm and well perfused. Capillary refill normal to all digits.   Neurologic Normal speech. Oriented to person, place, and time. Epicritic sensation to light touch grossly present bilaterally.  Dermatologic Clinically we epithelialized.  Adequate range of motion noted the first metatarsophalangeal  joint pes planovalgus foot structure noted  Orthopedic: No further tenderness to palpation noted about the surgical site.   Radiographs: 3 views of skeletally mature adult right foot: Hardware is intact.  No signs of loosening or backing out noted.  Reduction of sesamoid noted.  Reduction of bunion deformity noted. Assessment:   1. Hav (hallux abducto valgus), right   2. Status post foot surgery      Plan:  Patient was evaluated and treated and all questions answered.  S/p foot surgery right -Clinically healed and has transition to regular shoes without any restrictions.  At this time I discussed orthotics and shoe gear modification for prevention.  Patient states understanding.  Pes planovalgus -I explained to patient the etiology of pes planovalgus and relationship with bunion deformity and various treatment options were discussed.  Given patient foot structure in the setting of Planter fasciitis I believe patient will benefit from custom-made orthotics to help control the hindfoot motion support the arch of the foot and take the stress away from plantar fascial.  Patient agrees with the plan like to proceed with orthotics -Orthotics were dispensed.   No follow-ups on file.

## 2021-11-17 NOTE — Patient Instructions (Signed)

## 2022-01-10 DIAGNOSIS — S92354A Nondisplaced fracture of fifth metatarsal bone, right foot, initial encounter for closed fracture: Secondary | ICD-10-CM | POA: Diagnosis not present

## 2022-01-12 ENCOUNTER — Ambulatory Visit (INDEPENDENT_AMBULATORY_CARE_PROVIDER_SITE_OTHER): Payer: 59 | Admitting: Podiatry

## 2022-01-12 ENCOUNTER — Encounter: Payer: Self-pay | Admitting: Podiatry

## 2022-01-12 ENCOUNTER — Ambulatory Visit (INDEPENDENT_AMBULATORY_CARE_PROVIDER_SITE_OTHER): Payer: 59

## 2022-01-12 ENCOUNTER — Other Ambulatory Visit: Payer: Self-pay

## 2022-01-12 DIAGNOSIS — S92901A Unspecified fracture of right foot, initial encounter for closed fracture: Secondary | ICD-10-CM

## 2022-01-12 NOTE — Progress Notes (Signed)
?Subjective:  ?Patient ID: Kari Gibson, female    DOB: [birth year],  MRN: [id number] ? ?Chief Complaint  ?Patient presents with  ? Fracture  ?  Patient feel 2 days ago, heard pop in right foot  ? ? ?41 y.o. female presents with the above complaint.  Patient presents with new complaint of right lateral foot pain.  She states that she fell down 2 days ago on Tuesday, 01/09/2022 and her foot got caught and since then it has been very painful.  She went to urgent care where they diagnosed with a Jones fracture.  She came here for further evaluation and management of the fracture.  She states that it has been hard to ambulate on.  She has not been able to put any weight down.  Hurts with pressure.  She denies any other acute complaints pain scale 7 out of 10 dull aching nature ? ? ?Review of Systems: Negative except as noted in the HPI. Denies N/V/F/Ch. ? ?No past medical history on file. ? ?Current Outpatient Medications:  ?  levothyroxine (SYNTHROID) 75 MCG tablet, Take on an empty stomach with a glass of water at least 30-60 minutes before breakfast., Disp: , Rfl:  ?  ibuprofen (ADVIL) 800 MG tablet, Take 1 tablet (800 mg total) by mouth every 6 (six) hours as needed., Disp: 60 tablet, Rfl: 1 ?  meloxicam (MOBIC) 15 MG tablet, Take 1 tablet (15 mg total) by mouth daily., Disp: 30 tablet, Rfl: 0 ?  methocarbamol (ROBAXIN) 500 MG tablet, Take 1 tablet (500 mg total) by mouth every 6 (six) hours as needed for muscle spasms., Disp: 30 tablet, Rfl: 0 ?  oxyCODONE-acetaminophen (PERCOCET) 5-325 MG tablet, Take 1 tablet by mouth every 4 (four) hours as needed for severe pain., Disp: 30 tablet, Rfl: 0 ?  Risankizumab-rzaa (SKYRIZI PEN) 150 MG/ML SOAJ, Skyrizi 150 mg/mL subcutaneous pen injector, Disp: , Rfl:  ?  traMADol (ULTRAM) 50 MG tablet, Take 1 tablet (50 mg total) by mouth every 6 (six) hours as needed., Disp: 12 tablet, Rfl: 0 ? ?Social History  ? ?Tobacco Use  ?Smoking Status Never  ?Smokeless Tobacco Never   ? ? ?No Known Allergies ?Objective:  ?There were no vitals filed for this visit. ?There is no height or weight on file to calculate BMI. ?Constitutional Well developed. ?Well nourished.  ?Vascular Dorsalis pedis pulses palpable bilaterally. ?Posterior tibial pulses palpable bilaterally. ?Capillary refill normal to all digits.  ?No cyanosis or clubbing noted. ?Pedal hair growth normal.  ?Neurologic Normal speech. ?Oriented to person, place, and time. ?Epicritic sensation to light touch grossly present bilaterally.  ?Dermatologic Nails well groomed and normal in appearance. ?No open wounds. ?No skin lesions.  ?Orthopedic: Pain on palpation along the course of the fifth metatarsal.  1+ pitting edema noted around the fracture site.  Pain with dorsiflexion eversion of the foot likely concerning for peroneal tendinitis.  No Lisfranc interval pain noted.  ? ?Radiographs: 3 views of schedule mature the right foot: Right fifth metatarsal fracture noted diaphyseal metaphyseal junction also known as Jones fracture.  It is well aligned.  No step-off or shortening of the fracture noted.  No bone callus noted at this time ?Assessment:  ? ?1. Closed fracture of right foot, initial encounter   ? ?Plan:  ?Patient was evaluated and treated and all questions answered. ? ?Right fifth metatarsal Jones fracture ?-I explained to the patient the etiology of Jones fracture versus treatment options were extensively discussed ?-I discussed with the patient  that we can begin conservative treatment with nonweightbearing to the right lower extremity with a cam boot and crutches/I walker.  She states understanding will obtain I walker/knee scooter to be nonweightbearing.  I discussed with her it can take many months to heal as patient as the fifth metatarsal does not have a good vascular flow.  She states understanding. ?-We will continue conservative care for now.  If there is no callus formation patient may need a surgical intervention.  She  may also benefit from a bone stimulator to allow bone growth and healing.  I will try to reach out to the rep to see if he can obtain during next clinical visit if no improvement ? ?No follow-ups on file.  ?

## 2022-01-19 ENCOUNTER — Encounter: Payer: Self-pay | Admitting: Podiatry

## 2022-02-09 ENCOUNTER — Ambulatory Visit (INDEPENDENT_AMBULATORY_CARE_PROVIDER_SITE_OTHER): Payer: 59 | Admitting: Podiatry

## 2022-02-09 DIAGNOSIS — S92901A Unspecified fracture of right foot, initial encounter for closed fracture: Secondary | ICD-10-CM

## 2022-02-16 NOTE — Progress Notes (Signed)
?Subjective:  ?Patient ID: Kari Gibson, female    DOB: 10-15-1981,  MRN: [id number] ? ?Chief Complaint  ?Patient presents with  ? Fracture  ? ? ?41 y.o. female presents with the above complaint.  Patient presents with follow-up of right fifth metatarsal Jones fracture.  She still has some residual pain but her pain has improved considerably.  She has been weightbearing as tolerated with a cam boot.  She states is little bit better she would like to discuss next treatment plan.  She denies any other acute complaints.  Still painful pain scale is 5 out of 10 as opposed to 8 or 9 out of 10 ? ? ?Review of Systems: Negative except as noted in the HPI. Denies N/V/F/Ch. ? ?No past medical history on file. ? ?Current Outpatient Medications:  ?  ibuprofen (ADVIL) 800 MG tablet, Take 1 tablet (800 mg total) by mouth every 6 (six) hours as needed., Disp: 60 tablet, Rfl: 1 ?  levothyroxine (SYNTHROID) 75 MCG tablet, Take on an empty stomach with a glass of water at least 30-60 minutes before breakfast., Disp: , Rfl:  ?  meloxicam (MOBIC) 15 MG tablet, Take 1 tablet (15 mg total) by mouth daily., Disp: 30 tablet, Rfl: 0 ?  methocarbamol (ROBAXIN) 500 MG tablet, Take 1 tablet (500 mg total) by mouth every 6 (six) hours as needed for muscle spasms., Disp: 30 tablet, Rfl: 0 ?  oxyCODONE-acetaminophen (PERCOCET) 5-325 MG tablet, Take 1 tablet by mouth every 4 (four) hours as needed for severe pain., Disp: 30 tablet, Rfl: 0 ?  Risankizumab-rzaa (SKYRIZI PEN) 150 MG/ML SOAJ, Skyrizi 150 mg/mL subcutaneous pen injector, Disp: , Rfl:  ?  traMADol (ULTRAM) 50 MG tablet, Take 1 tablet (50 mg total) by mouth every 6 (six) hours as needed., Disp: 12 tablet, Rfl: 0 ? ?Social History  ? ?Tobacco Use  ?Smoking Status Never  ?Smokeless Tobacco Never  ? ? ?No Known Allergies ?Objective:  ?There were no vitals filed for this visit. ?There is no height or weight on file to calculate BMI. ?Constitutional Well developed. ?Well nourished.   ?Vascular Dorsalis pedis pulses palpable bilaterally. ?Posterior tibial pulses palpable bilaterally. ?Capillary refill normal to all digits.  ?No cyanosis or clubbing noted. ?Pedal hair growth normal.  ?Neurologic Normal speech. ?Oriented to person, place, and time. ?Epicritic sensation to light touch grossly present bilaterally.  ?Dermatologic Nails well groomed and normal in appearance. ?No open wounds. ?No skin lesions.  ?Orthopedic: Pain on palpation along the course of the fifth metatarsal.  1+ pitting edema noted around the fracture site.  Pain with dorsiflexion eversion of the foot likely concerning for peroneal tendinitis.  No Lisfranc interval pain noted.  ? ?Radiographs: 3 views of schedule mature the right foot: Right fifth metatarsal fracture noted diaphyseal metaphyseal junction also known as Jones fracture.  It is well aligned.  No step-off or shortening of the fracture noted.  No bone callus noted at this time ?Assessment:  ? ?No diagnosis found. ? ?Plan:  ?Patient was evaluated and treated and all questions answered. ? ?Right fifth metatarsal Jones fracture ?-I explained to the patient the etiology of Jones fracture versus treatment options were extensively discussed ?-At this time continue doing cam boot immobilization to allow the fracture to heal appropriately.  She states understanding ?-We will continue conservative care for now.  If there is no callus formation patient may need a surgical intervention.   ?-If there is no improvement we will discuss bone stimulator ? ?Visit. ? ?  No follow-ups on file.  ?

## 2022-03-28 ENCOUNTER — Ambulatory Visit (INDEPENDENT_AMBULATORY_CARE_PROVIDER_SITE_OTHER): Payer: 59

## 2022-03-28 ENCOUNTER — Ambulatory Visit (INDEPENDENT_AMBULATORY_CARE_PROVIDER_SITE_OTHER): Payer: 59 | Admitting: Podiatry

## 2022-03-28 DIAGNOSIS — S92901A Unspecified fracture of right foot, initial encounter for closed fracture: Secondary | ICD-10-CM | POA: Diagnosis not present

## 2022-03-28 NOTE — Progress Notes (Signed)
Subjective:  Patient ID: Kari Gibson, female    DOB: 08-19-1981,  MRN: [id number]  Chief Complaint  Patient presents with   Fracture    41 y.o. female presents with the above complaint.  Patient presents with follow-up of right fifth metatarsal Jones fracture.  She still has some residual pain but her pain has improved considerably.  She has been weightbearing as tolerated with a cam boot.  She states is little bit better she would like to discuss next treatment plan.  Her pain is 1 out of 10 is doing much better she is ready to transition out of the boot   Review of Systems: Negative except as noted in the HPI. Denies N/V/F/Ch.  No past medical history on file.  Current Outpatient Medications:    ibuprofen (ADVIL) 800 MG tablet, Take 1 tablet (800 mg total) by mouth every 6 (six) hours as needed., Disp: 60 tablet, Rfl: 1   levothyroxine (SYNTHROID) 75 MCG tablet, Take on an empty stomach with a glass of water at least 30-60 minutes before breakfast., Disp: , Rfl:    meloxicam (MOBIC) 15 MG tablet, Take 1 tablet (15 mg total) by mouth daily., Disp: 30 tablet, Rfl: 0   methocarbamol (ROBAXIN) 500 MG tablet, Take 1 tablet (500 mg total) by mouth every 6 (six) hours as needed for muscle spasms., Disp: 30 tablet, Rfl: 0   oxyCODONE-acetaminophen (PERCOCET) 5-325 MG tablet, Take 1 tablet by mouth every 4 (four) hours as needed for severe pain., Disp: 30 tablet, Rfl: 0   Risankizumab-rzaa (SKYRIZI PEN) 150 MG/ML SOAJ, Skyrizi 150 mg/mL subcutaneous pen injector, Disp: , Rfl:    traMADol (ULTRAM) 50 MG tablet, Take 1 tablet (50 mg total) by mouth every 6 (six) hours as needed., Disp: 12 tablet, Rfl: 0  Social History   Tobacco Use  Smoking Status Never  Smokeless Tobacco Never    No Known Allergies Objective:  There were no vitals filed for this visit. There is no height or weight on file to calculate BMI. Constitutional Well developed. Well nourished.  Vascular Dorsalis pedis  pulses palpable bilaterally. Posterior tibial pulses palpable bilaterally. Capillary refill normal to all digits.  No cyanosis or clubbing noted. Pedal hair growth normal.  Neurologic Normal speech. Oriented to person, place, and time. Epicritic sensation to light touch grossly present bilaterally.  Dermatologic Nails well groomed and normal in appearance. No open wounds. No skin lesions.  Orthopedic: Pain on palpation along the course of the fifth metatarsal.  1+ pitting edema noted around the fracture site.  Pain with dorsiflexion eversion of the foot likely concerning for peroneal tendinitis.  No Lisfranc interval pain noted.   Radiographs: 3 views of schedule mature the right foot: Right fifth metatarsal fracture noted diaphyseal metaphyseal junction also known as Jones fracture.  It is well aligned.  No step-off or shortening of the fracture noted.  No bone callus noted at this time. Assessment:   1. Closed fracture of right foot, initial encounter     Plan:  Patient was evaluated and treated and all questions answered.  Right fifth metatarsal Jones fracture~improving -All questions and concerns were discussed with the patient in extensive detail. -Patient can begin transitioning to regular shoes from cam boot as her pain has improved considerably -Continue conservative care as the fracture gapping is closing slowly.  I discussed with the patient it can take up to a year.  She states understanding. -We can also discuss bone stimulator in the near future if  there is no improvement -However at this time patient's pain has completely improved and I discussed to begin transition of the boot if she is not able to transition out of the boot we will discuss surgical options at that time.   No follow-ups on file.
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
## 2022-04-26 DIAGNOSIS — E890 Postprocedural hypothyroidism: Secondary | ICD-10-CM | POA: Diagnosis not present

## 2022-04-26 DIAGNOSIS — E282 Polycystic ovarian syndrome: Secondary | ICD-10-CM | POA: Diagnosis not present

## 2022-05-10 DIAGNOSIS — S62636A Displaced fracture of distal phalanx of right little finger, initial encounter for closed fracture: Secondary | ICD-10-CM | POA: Diagnosis not present

## 2022-05-11 DIAGNOSIS — S62636D Displaced fracture of distal phalanx of right little finger, subsequent encounter for fracture with routine healing: Secondary | ICD-10-CM | POA: Diagnosis not present

## 2022-05-23 DIAGNOSIS — M20011 Mallet finger of right finger(s): Secondary | ICD-10-CM | POA: Diagnosis not present

## 2022-06-07 DIAGNOSIS — L403 Pustulosis palmaris et plantaris: Secondary | ICD-10-CM | POA: Diagnosis not present

## 2022-06-07 DIAGNOSIS — Z79899 Other long term (current) drug therapy: Secondary | ICD-10-CM | POA: Diagnosis not present

## 2022-06-20 DIAGNOSIS — S62639D Displaced fracture of distal phalanx of unspecified finger, subsequent encounter for fracture with routine healing: Secondary | ICD-10-CM | POA: Diagnosis not present

## 2022-06-28 DIAGNOSIS — F411 Generalized anxiety disorder: Secondary | ICD-10-CM | POA: Diagnosis not present

## 2022-07-01 ENCOUNTER — Encounter: Payer: Self-pay | Admitting: Emergency Medicine

## 2022-07-01 ENCOUNTER — Emergency Department: Payer: 59

## 2022-07-01 ENCOUNTER — Emergency Department
Admission: EM | Admit: 2022-07-01 | Discharge: 2022-07-01 | Disposition: A | Discharge status: Home / Self Care | Payer: 59 | Attending: Emergency Medicine | Admitting: Emergency Medicine

## 2022-07-01 ENCOUNTER — Other Ambulatory Visit: Payer: Self-pay

## 2022-07-01 DIAGNOSIS — R0789 Other chest pain: Secondary | ICD-10-CM

## 2022-07-01 DIAGNOSIS — E039 Hypothyroidism, unspecified: Secondary | ICD-10-CM | POA: Diagnosis not present

## 2022-07-01 DIAGNOSIS — R002 Palpitations: Secondary | ICD-10-CM

## 2022-07-01 DIAGNOSIS — R0602 Shortness of breath: Secondary | ICD-10-CM | POA: Diagnosis not present

## 2022-07-01 DIAGNOSIS — R079 Chest pain, unspecified: Secondary | ICD-10-CM | POA: Diagnosis not present

## 2022-07-01 HISTORY — DX: Hypothyroidism, unspecified: E03.9

## 2022-07-01 LAB — BASIC METABOLIC PANEL
Anion gap: 7 (ref 5–15)
BUN: 14 mg/dL (ref 6–20)
CO2: 24 mmol/L (ref 22–32)
Calcium: 8.5 mg/dL — ABNORMAL LOW (ref 8.9–10.3)
Chloride: 108 mmol/L (ref 98–111)
Creatinine, Ser: 0.79 mg/dL (ref 0.44–1.00)
GFR, Estimated: >60 mL/min (ref >60)
Glucose, Bld: 100 mg/dL — ABNORMAL HIGH (ref 70–99)
Potassium: 3.8 mmol/L (ref 3.5–5.1)
Sodium: 139 mmol/L (ref 135–145)

## 2022-07-01 LAB — CBC
HCT: 40.1 % (ref 36.0–46.0)
Hemoglobin: 13.5 g/dL (ref 12.0–15.0)
MCH: 31.3 pg (ref 26.0–34.0)
MCHC: 33.7 g/dL (ref 30.0–36.0)
MCV: 92.8 fL (ref 80.0–100.0)
Platelets: 215 10*3/uL (ref 150–400)
RBC: 4.32 MIL/uL (ref 3.87–5.11)
RDW: 12 % (ref 11.5–15.5)
WBC: 7.4 10*3/uL (ref 4.0–10.5)
nRBC: 0 % (ref 0.0–0.2)

## 2022-07-01 LAB — TROPONIN I (HIGH SENSITIVITY)
Troponin I (High Sensitivity): <2 ng/L (ref <18)
Troponin I (High Sensitivity): <2 ng/L (ref <18)

## 2022-07-01 LAB — D-DIMER, QUANTITATIVE: D-Dimer, Quant: 0.31 ug/mL-FEU (ref 0.00–0.50)

## 2022-07-01 NOTE — ED Provider Notes (Addendum)
[facility] Provider Note    Event Date/Time   First MD Initiated Contact with Patient 07/01/22 1002     (approximate)   History   Chest Pain (sob) and Shortness of Breath   HPI  Kari Gibson is a 41 y.o. female with hypothyroidism who comes in with concerns for chest pain and shortness of breath.  Patient reports 1 month having irregular heartbeats.  With intermittent shortness of breath for the past week.  Patient reports that these episodes are happening intermittently.  Unclear what brings them on.  She denies any abdominal pain associated with them.  She reports having recent thyroid checks that been reassuring.  She denies any risk factors for PE although she does report some family history of both her brother and her mom having blood clots this was also in the setting of the both being ill.   Physical Exam   Triage Vital Signs: ED Triage Vitals  Enc Vitals Group     BP 07/01/22 0745 133/81     Pulse Rate 07/01/22 0745 75     Resp 07/01/22 0745 18     Temp 07/01/22 0745 98.7 F (37.1 C)     Temp Source 07/01/22 0745 Oral     SpO2 07/01/22 0745 98 %     Weight 07/01/22 0740 195 lb (88.5 kg)     Height 07/01/22 0740 5\' 4"  (1.626 m)     Head Circumference --      Peak Flow --      Pain Score 07/01/22 0739 2     Pain Loc --      Pain Edu? --      Excl. in GC? --     Most recent vital signs: Vitals:   07/01/22 0745  BP: 133/81  Pulse: 75  Resp: 18  Temp: 98.7 F (37.1 C)  SpO2: 98%     General: Awake, no distress.  CV:  Good peripheral perfusion.  Resp:  Normal effort.  Abd:  No distention.  Other:  No swelling the legs.  No calf tenderness   ED Results / Procedures / Treatments   Labs (all labs ordered are listed, but only abnormal results are displayed) Labs Reviewed  BASIC METABOLIC PANEL - Abnormal; Notable for the following components:      Result Value   Glucose, Bld 100 (*)    Calcium 8.5 (*)    All other  components within normal limits  CBC  POC URINE PREG, ED  TROPONIN I (HIGH SENSITIVITY)  TROPONIN I (HIGH SENSITIVITY)     EKG  My interpretation of EKG:  Normal sinus rhythm 78 without any ST elevation or T wave inversions, normal intervals  RADIOLOGY I have reviewed the xray personally and interpreted  no evidence of pneumonia   PROCEDURES:  Critical Care performed: No  Procedures   MEDICATIONS ORDERED IN ED: Medications - No data to display   IMPRESSION / MDM / ASSESSMENT AND PLAN / ED COURSE  I reviewed the triage vital signs and the nursing notes.   Patient's presentation is most consistent with acute presentation with potential threat to life or bodily function.   Differential includes arrhythmia, ACS, PNA, PTX.  Will get labs, chest x-ray to further evaluate.  Considered COVID testing but this been going ongoing for over a month so seems less likely to be COVID.  Troponin was a negative.  BMP reassuring CBC reassuring  D-dimer is negative.  Troponins are negative x2.  Considered admission but given stable vital signs has been going on for a month, no current chest pain I think patient can be followed up outpatient with her primary care doctor as well as placed a referral for cardiology to discuss Holter monitor, echocardiogram.  This time patient feels comfortable with discharge home and will follow-up outpatient  Patient declined pregnancy test and reports having a tubal ligation  The patient is on the cardiac monitor to evaluate for evidence of arrhythmia and/or significant heart rate changes.      FINAL CLINICAL IMPRESSION(S) / ED DIAGNOSES   Final diagnoses:  Atypical chest pain  Palpitations     Rx / DC Orders   ED Discharge Orders          Ordered    Ambulatory referral to Cardiology        07/01/22 1223             Note:  This document was prepared using Dragon voice recognition software and may include unintentional dictation  errors.   Concha Se, MD 07/01/22 1224    Concha Se, MD 07/01/22 [phone]

## 2022-07-01 NOTE — Discharge Instructions (Addendum)
No signs of heart attack or blood clots.  Please call the above number if you not hear from cardiology to make in a follow-up appointment and follow-up with your primary care doctor return to the ER if develop worsening symptoms or any other concerns

## 2022-07-01 NOTE — ED Triage Notes (Signed)
Pt reports a month ago started feeling like she had an irregular heartbeat. Pt states that she then had intermittent episodes of SOB and over the past week her chest has been hurting intermittently. Pt describes the pain as pressure like and sometimes she can feel it into her left arm.

## 2022-07-05 DIAGNOSIS — F411 Generalized anxiety disorder: Secondary | ICD-10-CM | POA: Diagnosis not present

## 2022-07-10 DIAGNOSIS — F411 Generalized anxiety disorder: Secondary | ICD-10-CM | POA: Diagnosis not present

## 2022-07-20 DIAGNOSIS — F411 Generalized anxiety disorder: Secondary | ICD-10-CM | POA: Diagnosis not present

## 2022-07-31 DIAGNOSIS — F411 Generalized anxiety disorder: Secondary | ICD-10-CM | POA: Diagnosis not present

## 2022-08-10 ENCOUNTER — Other Ambulatory Visit: Payer: Self-pay | Admitting: Student

## 2022-08-10 DIAGNOSIS — Z1231 Encounter for screening mammogram for malignant neoplasm of breast: Secondary | ICD-10-CM

## 2022-08-10 DIAGNOSIS — E282 Polycystic ovarian syndrome: Secondary | ICD-10-CM | POA: Diagnosis not present

## 2022-08-10 DIAGNOSIS — Z1331 Encounter for screening for depression: Secondary | ICD-10-CM | POA: Diagnosis not present

## 2022-08-10 DIAGNOSIS — Z0000 Encounter for general adult medical examination without abnormal findings: Secondary | ICD-10-CM | POA: Diagnosis not present

## 2022-08-10 DIAGNOSIS — Z130 Encounter for screening for diseases of the blood and blood-forming organs and certain disorders involving the immune mechanism: Secondary | ICD-10-CM | POA: Diagnosis not present

## 2022-08-10 DIAGNOSIS — Z131 Encounter for screening for diabetes mellitus: Secondary | ICD-10-CM | POA: Diagnosis not present

## 2022-08-10 DIAGNOSIS — L409 Psoriasis, unspecified: Secondary | ICD-10-CM | POA: Diagnosis not present

## 2022-08-10 DIAGNOSIS — Z13220 Encounter for screening for lipoid disorders: Secondary | ICD-10-CM | POA: Diagnosis not present

## 2022-08-10 DIAGNOSIS — E890 Postprocedural hypothyroidism: Secondary | ICD-10-CM | POA: Diagnosis not present

## 2022-08-10 DIAGNOSIS — Z800 Family history of malignant neoplasm of digestive organs: Secondary | ICD-10-CM | POA: Diagnosis not present

## 2022-08-14 DIAGNOSIS — F411 Generalized anxiety disorder: Secondary | ICD-10-CM | POA: Diagnosis not present

## 2022-08-28 DIAGNOSIS — F411 Generalized anxiety disorder: Secondary | ICD-10-CM | POA: Diagnosis not present

## 2022-10-05 ENCOUNTER — Ambulatory Visit
Admission: RE | Admit: 2022-10-05 | Discharge: 2022-10-05 | Disposition: A | Discharge status: Home / Self Care | Payer: 59 | Source: Ambulatory Visit | Attending: Student | Admitting: Student

## 2022-10-05 ENCOUNTER — Other Ambulatory Visit: Payer: Self-pay | Admitting: Student

## 2022-10-05 DIAGNOSIS — Z1231 Encounter for screening mammogram for malignant neoplasm of breast: Secondary | ICD-10-CM

## 2022-10-05 HISTORY — DX: Breast implant status: Z98.82

## 2022-11-07 DIAGNOSIS — F411 Generalized anxiety disorder: Secondary | ICD-10-CM | POA: Diagnosis not present

## 2022-11-08 DIAGNOSIS — E282 Polycystic ovarian syndrome: Secondary | ICD-10-CM | POA: Diagnosis not present

## 2022-11-08 DIAGNOSIS — E890 Postprocedural hypothyroidism: Secondary | ICD-10-CM | POA: Diagnosis not present

## 2022-11-23 DIAGNOSIS — Z800 Family history of malignant neoplasm of digestive organs: Secondary | ICD-10-CM | POA: Diagnosis not present

## 2023-01-19 DIAGNOSIS — H1033 Unspecified acute conjunctivitis, bilateral: Secondary | ICD-10-CM | POA: Diagnosis not present

## 2023-01-31 ENCOUNTER — Ambulatory Visit
Admission: RE | Admit: 2023-01-31 | Discharge: 2023-01-31 | Disposition: A | Discharge status: Home / Self Care | Payer: 59 | Source: Ambulatory Visit | Attending: Student | Admitting: Student

## 2023-01-31 ENCOUNTER — Other Ambulatory Visit: Payer: Self-pay | Admitting: Student

## 2023-01-31 DIAGNOSIS — M79604 Pain in right leg: Secondary | ICD-10-CM | POA: Diagnosis not present

## 2023-01-31 DIAGNOSIS — M79661 Pain in right lower leg: Secondary | ICD-10-CM | POA: Insufficient documentation

## 2023-02-06 DIAGNOSIS — F411 Generalized anxiety disorder: Secondary | ICD-10-CM | POA: Diagnosis not present

## 2023-03-09 ENCOUNTER — Encounter: Payer: Self-pay | Admitting: *Deleted

## 2023-03-12 ENCOUNTER — Encounter: Payer: Self-pay | Admitting: *Deleted

## 2023-03-12 ENCOUNTER — Ambulatory Visit: Payer: 59 | Admitting: Anesthesiology

## 2023-03-12 ENCOUNTER — Ambulatory Visit
Admission: RE | Admit: 2023-03-12 | Discharge: 2023-03-12 | Disposition: A | Discharge status: Home / Self Care | Payer: 59 | Attending: Gastroenterology | Admitting: Gastroenterology

## 2023-03-12 ENCOUNTER — Encounter
Admission: RE | Disposition: A | Discharge status: Home / Self Care | Payer: Self-pay | Source: Home / Self Care | Attending: Gastroenterology

## 2023-03-12 DIAGNOSIS — E039 Hypothyroidism, unspecified: Secondary | ICD-10-CM | POA: Insufficient documentation

## 2023-03-12 DIAGNOSIS — K635 Polyp of colon: Secondary | ICD-10-CM | POA: Diagnosis not present

## 2023-03-12 DIAGNOSIS — L409 Psoriasis, unspecified: Secondary | ICD-10-CM | POA: Diagnosis not present

## 2023-03-12 DIAGNOSIS — Z800 Family history of malignant neoplasm of digestive organs: Secondary | ICD-10-CM | POA: Insufficient documentation

## 2023-03-12 DIAGNOSIS — K649 Unspecified hemorrhoids: Secondary | ICD-10-CM | POA: Diagnosis not present

## 2023-03-12 DIAGNOSIS — Z1211 Encounter for screening for malignant neoplasm of colon: Secondary | ICD-10-CM | POA: Insufficient documentation

## 2023-03-12 DIAGNOSIS — F419 Anxiety disorder, unspecified: Secondary | ICD-10-CM | POA: Insufficient documentation

## 2023-03-12 DIAGNOSIS — K640 First degree hemorrhoids: Secondary | ICD-10-CM | POA: Insufficient documentation

## 2023-03-12 HISTORY — PX: COLONOSCOPY WITH PROPOFOL: SHX5780

## 2023-03-12 LAB — POCT PREGNANCY, URINE: Preg Test, Ur: NEGATIVE

## 2023-03-12 SURGERY — COLONOSCOPY WITH PROPOFOL
Anesthesia: General

## 2023-03-12 MED ORDER — PROPOFOL 10 MG/ML IV BOLUS
INTRAVENOUS | Status: DC | PRN
  Administered 2023-03-12 (×2): 30 mg via INTRAVENOUS
  Administered 2023-03-12: 70 mg via INTRAVENOUS

## 2023-03-12 MED ORDER — MIDAZOLAM HCL 2 MG/2ML IJ SOLN
INTRAMUSCULAR | Status: DC | PRN
  Administered 2023-03-12: 2 mg via INTRAVENOUS

## 2023-03-12 MED ORDER — PROPOFOL 500 MG/50ML IV EMUL
INTRAVENOUS | Status: DC | PRN
  Administered 2023-03-12: 175 ug/kg/min via INTRAVENOUS

## 2023-03-12 MED ORDER — PROPOFOL 10 MG/ML IV BOLUS
INTRAVENOUS | Status: AC
  Filled 2023-03-12: qty 20

## 2023-03-12 MED ORDER — MIDAZOLAM HCL 2 MG/2ML IJ SOLN
INTRAMUSCULAR | Status: AC
  Filled 2023-03-12: qty 2

## 2023-03-12 MED ORDER — SODIUM CHLORIDE 0.9 % IV SOLN: INTRAVENOUS | Status: DC

## 2023-03-12 MED ORDER — LIDOCAINE HCL (CARDIAC) PF 100 MG/5ML IV SOSY
PREFILLED_SYRINGE | INTRAVENOUS | Status: DC | PRN
  Administered 2023-03-12: 100 mg via INTRAVENOUS

## 2023-03-12 NOTE — Anesthesia Preprocedure Evaluation (Addendum)
Anesthesia Evaluation  Patient identified by MRN, date of birth, ID band Patient awake    Reviewed: Allergy & Precautions, NPO status , Patient's Chart, lab work & pertinent test results  History of Anesthesia Complications Negative for: history of anesthetic complications  Airway Mallampati: III  TM Distance: <3 FB Neck ROM: full    Dental  (+) Chipped   Pulmonary neg pulmonary ROS, neg shortness of breath   Pulmonary exam normal        Cardiovascular Exercise Tolerance: Good (-) angina negative cardio ROS Normal cardiovascular exam     Neuro/Psych negative neurological ROS  negative psych ROS   GI/Hepatic negative GI ROS, Neg liver ROS,neg GERD  ,,  Endo/Other  Hypothyroidism    Renal/GU negative Renal ROS  negative genitourinary   Musculoskeletal   Abdominal   Peds  Hematology negative hematology ROS (+)   Anesthesia Other Findings Past Medical History: No date: H/O bilateral breast implants     Comment:  2018 No date: Hypothyroidism  History reviewed. No pertinent surgical history.  BMI    Body Mass Index: 32.96 kg/m      Reproductive/Obstetrics negative OB ROS                             Anesthesia Physical Anesthesia Plan  ASA: 2  Anesthesia Plan: General   Post-op Pain Management:    Induction: Intravenous  PONV Risk Score and Plan: Propofol infusion and TIVA  Airway Management Planned: Natural Airway and Nasal Cannula  Additional Equipment:   Intra-op Plan:   Post-operative Plan:   Informed Consent: I have reviewed the patients History and Physical, chart, labs and discussed the procedure including the risks, benefits and alternatives for the proposed anesthesia with the patient or authorized representative who has indicated his/her understanding and acceptance.     Dental Advisory Given  Plan Discussed with: Anesthesiologist, CRNA and  Surgeon  Anesthesia Plan Comments: (Patient consented for risks of anesthesia including but not limited to:  - adverse reactions to medications - risk of airway placement if required - damage to eyes, teeth, lips or other oral mucosa - nerve damage due to positioning  - sore throat or hoarseness - Damage to heart, brain, nerves, lungs, other parts of body or loss of life  Patient voiced understanding.)       Anesthesia Quick Evaluation

## 2023-03-12 NOTE — H&P (Signed)
Outpatient short stay form Pre-procedure 03/12/2023  Regis Bill, MD  Primary Physician: Carren Rang, PA-C  Reason for visit:  High risk screening  History of present illness:    42 y/o lady with history of psoriasis, hypothyroidism, and anxiety here for screening colonoscopy. Mother had colon cancer in her 88's. History of appendectomy. No blood thinners.     Current Facility-Administered Medications:    0.9 %  sodium chloride infusion, , Intravenous, Continuous, Emmerson Taddei, Rossie Muskrat, MD, Last Rate: 20 mL/hr at 03/12/23 1411, New Bag at 03/12/23 1411  Medications Prior to Admission  Medication Sig Dispense Refill Last Dose   escitalopram (LEXAPRO) 5 MG tablet Take 5 mg by mouth daily.   03/11/2023   ibuprofen (ADVIL) 800 MG tablet Take 1 tablet (800 mg total) by mouth every 6 (six) hours as needed. 60 tablet 1 Past Month   levothyroxine (SYNTHROID) 75 MCG tablet Take on an empty stomach with a glass of water at least 30-60 minutes before breakfast.   03/11/2023   Risankizumab-rzaa (SKYRIZI PEN) 150 MG/ML SOAJ Skyrizi 150 mg/mL subcutaneous pen injector   Past Month   Tirzepatide-Weight Management (ZEPBOUND [place]) Inject into the skin.   Past Month   meloxicam (MOBIC) 15 MG tablet Take 1 tablet (15 mg total) by mouth daily. (Patient not taking: Reported on 03/05/2023) 30 tablet 0 Not Taking   methocarbamol (ROBAXIN) 500 MG tablet Take 1 tablet (500 mg total) by mouth every 6 (six) hours as needed for muscle spasms. (Patient not taking: Reported on 03/05/2023) 30 tablet 0 Not Taking   oxyCODONE-acetaminophen (PERCOCET) 5-325 MG tablet Take 1 tablet by mouth every 4 (four) hours as needed for severe pain. (Patient not taking: Reported on 03/05/2023) 30 tablet 0 Not Taking   traMADol (ULTRAM) 50 MG tablet Take 1 tablet (50 mg total) by mouth every 6 (six) hours as needed. (Patient not taking: Reported on 03/05/2023) 12 tablet 0 Not Taking     Allergies  Allergen Reactions   Shellfish  Allergy Anaphylaxis     Past Medical History:  Diagnosis Date   H/O bilateral breast implants    2018   Hypothyroidism     Review of systems:  Otherwise negative.    Physical Exam  Gen: Alert, oriented. Appears stated age.  HEENT: PERRLA. Lungs: No respiratory distress CV: RRR Abd: soft, benign, no masses Ext: No edema    Planned procedures: Proceed with colonoscopy. The patient understands the nature of the planned procedure, indications, risks, alternatives and potential complications including but not limited to bleeding, infection, perforation, damage to internal organs and possible oversedation/side effects from anesthesia. The patient agrees and gives consent to proceed.  Please refer to procedure notes for findings, recommendations and patient disposition/instructions.     Regis Bill, MD [facility]

## 2023-03-12 NOTE — Interval H&P Note (Signed)
History and Physical Interval Note:  03/12/2023 2:17 PM  Kari Gibson  has presented today for surgery, with the diagnosis of FH Colon Cancer.  The various methods of treatment have been discussed with the patient and family. After consideration of risks, benefits and other options for treatment, the patient has consented to  Procedure(s): COLONOSCOPY WITH PROPOFOL (N/A) as a surgical intervention.  The patient's history has been reviewed, patient examined, no change in status, stable for surgery.  I have reviewed the patient's chart and labs.  Questions were answered to the patient's satisfaction.     Regis Bill  Ok to proceed with colonoscopy

## 2023-03-12 NOTE — Op Note (Signed)
[facility] Patient Name: Kari Gibson Procedure Date: 03/12/2023 1:52 PM MRN: [id number] Account #: [id number] Date of Birth: [birth year] Admit Type: Outpatient Age: 42 Room: [facility] ENDO ROOM 3 Gender: Female Note Status: Finalized Instrument Name: Prentice Docker [id number] Procedure:             Colonoscopy Indications:           Screening in patient at increased risk: Colorectal                         cancer in mother 65 or older Providers:             Eather Colas MD, MD Referring MD:          Carren Rang (Referring MD) Medicines:             Monitored Anesthesia Care Complications:         No immediate complications. Estimated blood loss:                         Minimal. Procedure:             Pre-Anesthesia Assessment:                        - Prior to the procedure, a History and Physical was                         performed, and patient medications and allergies were                         reviewed. The patient is competent. The risks and                         benefits of the procedure and the sedation options and                         risks were discussed with the patient. All questions                         were answered and informed consent was obtained.                         Patient identification and proposed procedure were                         verified by the physician, the nurse, the                         anesthesiologist, the anesthetist and the technician                         in the endoscopy suite. Mental Status Examination:                         alert and oriented. Airway Examination: normal                         oropharyngeal airway and neck mobility. Respiratory  Examination: clear to auscultation. CV Examination:                         normal. Prophylactic Antibiotics: The patient does not                         require prophylactic antibiotics. Prior                          Anticoagulants: The patient has taken no anticoagulant                         or antiplatelet agents. ASA Grade Assessment: II - A                         patient with mild systemic disease. After reviewing                         the risks and benefits, the patient was deemed in                         satisfactory condition to undergo the procedure. The                         anesthesia plan was to use monitored anesthesia care                         (MAC). Immediately prior to administration of                         medications, the patient was re-assessed for adequacy                         to receive sedatives. The heart rate, respiratory                         rate, oxygen saturations, blood pressure, adequacy of                         pulmonary ventilation, and response to care were                         monitored throughout the procedure. The physical                         status of the patient was re-assessed after the                         procedure.                        After obtaining informed consent, the colonoscope was                         passed under direct vision. Throughout the procedure,                         the patient's blood pressure, pulse, and oxygen  saturations were monitored continuously. The                         Colonoscope was introduced through the anus and                         advanced to the the cecum, identified by appendiceal                         orifice and ileocecal valve. The colonoscopy was                         performed without difficulty. The patient tolerated                         the procedure well. The quality of the bowel                         preparation was good. The ileocecal valve, appendiceal                         orifice, and rectum were photographed. Findings:      The perianal and digital rectal examinations were normal.      A 2 mm polyp was found in the transverse colon.  The polyp was sessile.       The polyp was removed with a cold snare. Resection was complete, but the       polyp tissue was not retrieved. Estimated blood loss was minimal.      Internal hemorrhoids were found during retroflexion. The hemorrhoids       were Grade I (internal hemorrhoids that do not prolapse).      The exam was otherwise without abnormality on direct and retroflexion       views. Impression:            - One 2 mm polyp in the transverse colon, removed with                         a cold snare. Complete resection. Polyp tissue not                         retrieved.                        - Internal hemorrhoids.                        - The examination was otherwise normal on direct and                         retroflexion views. Recommendation:        - Discharge patient to home.                        - Resume previous diet.                        - Continue present medications.                        - Repeat colonoscopy in 7 years  for surveillance.                        - Return to referring physician as previously                         scheduled. Procedure Code(s):     --- Professional ---                        [phone], Colonoscopy, flexible; with removal of                         tumor(s), polyp(s), or other lesion(s) by snare                         technique Diagnosis Code(s):     --- Professional ---                        Z80.0, Family history of malignant neoplasm of                         digestive organs                        D12.3, Benign neoplasm of transverse colon (hepatic                         flexure or splenic flexure)                        K64.0, First degree hemorrhoids CPT copyright 2022 [employer]. All rights reserved. The codes documented in this report are preliminary and upon coder review may  be revised to meet current compliance requirements. Eather Colas MD, MD 03/12/2023 2:46:24 PM Number of Addenda: 0 Note  Initiated On: 03/12/2023 1:52 PM Scope Withdrawal Time: 0 hours 11 minutes 50 seconds  Total Procedure Duration: 0 hours 16 minutes 9 seconds  Estimated Blood Loss:  Estimated blood loss was minimal.      [facility]

## 2023-03-12 NOTE — Anesthesia Procedure Notes (Signed)
Procedure Name: General with mask airway Date/Time: 03/12/2023 2:29 PM  Performed by: Mohammed Kindle, CRNAPre-anesthesia Checklist: Patient identified, Suction available, Emergency Drugs available and Patient being monitored Patient Re-evaluated:Patient Re-evaluated prior to induction Oxygen Delivery Method: Simple face mask Induction Type: IV induction Placement Confirmation: positive ETCO2, CO2 detector and breath sounds checked- equal and bilateral Dental Injury: Teeth and Oropharynx as per pre-operative assessment

## 2023-03-12 NOTE — Transfer of Care (Signed)
Immediate Anesthesia Transfer of Care Note  Patient: Kari Gibson  Procedure(s) Performed: COLONOSCOPY WITH PROPOFOL  Patient Location: Endoscopy Unit  Anesthesia Type:General  Level of Consciousness: drowsy and patient cooperative  Airway & Oxygen Therapy: Patient Spontanous Breathing and Patient connected to face mask oxygen  Post-op Assessment: Report given to RN and Post -op Vital signs reviewed and stable  Post vital signs: Reviewed and stable  Last Vitals:  Vitals Value Taken Time  BP 120/70 03/12/23 1442  Temp    Pulse 81 03/12/23 1442  Resp 13 03/12/23 1442  SpO2 100 % 03/12/23 1442  Vitals shown include unvalidated device data.  Last Pain:  Vitals:   03/12/23 1400  TempSrc: Tympanic  PainSc: Asleep         Complications: No notable events documented.

## 2023-03-12 NOTE — Progress Notes (Signed)
One cold snared transverse colon polyp not retrieved

## 2023-03-13 ENCOUNTER — Encounter: Payer: Self-pay | Admitting: Gastroenterology

## 2023-03-13 NOTE — Anesthesia Postprocedure Evaluation (Signed)
Anesthesia Post Note  Patient: Kari Gibson  Procedure(s) Performed: COLONOSCOPY WITH PROPOFOL  Patient location during evaluation: Endoscopy Anesthesia Type: General Level of consciousness: awake and alert Pain management: pain level controlled Vital Signs Assessment: post-procedure vital signs reviewed and stable Respiratory status: spontaneous breathing, nonlabored ventilation, respiratory function stable and patient connected to nasal cannula oxygen Cardiovascular status: blood pressure returned to baseline and stable Postop Assessment: no apparent nausea or vomiting Anesthetic complications: no   No notable events documented.   Last Vitals:  Vitals:   03/12/23 1452 03/12/23 1502  BP: 112/71 101/62  Pulse: 75 76  Resp: 17 16  Temp:    SpO2: 99%     Last Pain:  Vitals:   03/12/23 1502  TempSrc:   PainSc: 0-No pain                 Cleda Mccreedy Kari Gibson

## 2023-05-03 DIAGNOSIS — F411 Generalized anxiety disorder: Secondary | ICD-10-CM | POA: Diagnosis not present

## 2023-05-11 DIAGNOSIS — Z79899 Other long term (current) drug therapy: Secondary | ICD-10-CM | POA: Diagnosis not present

## 2023-05-11 DIAGNOSIS — L403 Pustulosis palmaris et plantaris: Secondary | ICD-10-CM | POA: Diagnosis not present

## 2023-05-11 DIAGNOSIS — L811 Chloasma: Secondary | ICD-10-CM | POA: Diagnosis not present

## 2023-05-22 DIAGNOSIS — H109 Unspecified conjunctivitis: Secondary | ICD-10-CM | POA: Diagnosis not present

## 2023-06-06 DIAGNOSIS — E890 Postprocedural hypothyroidism: Secondary | ICD-10-CM | POA: Diagnosis not present

## 2023-06-06 DIAGNOSIS — E282 Polycystic ovarian syndrome: Secondary | ICD-10-CM | POA: Diagnosis not present

## 2023-07-18 DIAGNOSIS — F411 Generalized anxiety disorder: Secondary | ICD-10-CM | POA: Diagnosis not present

## 2023-09-25 DIAGNOSIS — N921 Excessive and frequent menstruation with irregular cycle: Secondary | ICD-10-CM | POA: Diagnosis not present

## 2023-09-25 DIAGNOSIS — Z01411 Encounter for gynecological examination (general) (routine) with abnormal findings: Secondary | ICD-10-CM | POA: Diagnosis not present

## 2023-09-25 DIAGNOSIS — Z124 Encounter for screening for malignant neoplasm of cervix: Secondary | ICD-10-CM | POA: Diagnosis not present

## 2023-09-25 DIAGNOSIS — R1900 Intra-abdominal and pelvic swelling, mass and lump, unspecified site: Secondary | ICD-10-CM | POA: Diagnosis not present

## 2023-10-01 DIAGNOSIS — R1900 Intra-abdominal and pelvic swelling, mass and lump, unspecified site: Secondary | ICD-10-CM | POA: Diagnosis not present

## 2023-11-04 DIAGNOSIS — H109 Unspecified conjunctivitis: Secondary | ICD-10-CM | POA: Diagnosis not present

## 2023-12-19 DIAGNOSIS — E890 Postprocedural hypothyroidism: Secondary | ICD-10-CM | POA: Diagnosis not present

## 2023-12-27 DIAGNOSIS — F411 Generalized anxiety disorder: Secondary | ICD-10-CM | POA: Diagnosis not present

## 2024-02-12 DIAGNOSIS — N3001 Acute cystitis with hematuria: Secondary | ICD-10-CM | POA: Diagnosis not present

## 2024-04-18 DIAGNOSIS — L403 Pustulosis palmaris et plantaris: Secondary | ICD-10-CM | POA: Diagnosis not present

## 2024-09-30 ENCOUNTER — Other Ambulatory Visit: Payer: Self-pay | Admitting: Student

## 2024-09-30 DIAGNOSIS — Z1231 Encounter for screening mammogram for malignant neoplasm of breast: Secondary | ICD-10-CM
